# Patient Record
Sex: Female | Born: 1975 | Race: Black or African American | Hispanic: No | Marital: Single | Smoking: Never smoker
Health system: Southern US, Community
[De-identification: ages and names within clinical notes are randomized; demographics above are authoritative.]

## PROBLEM LIST (undated history)

## (undated) DIAGNOSIS — I10 Essential (primary) hypertension: Secondary | ICD-10-CM

## (undated) DIAGNOSIS — R112 Nausea with vomiting, unspecified: Secondary | ICD-10-CM

## (undated) DIAGNOSIS — K219 Gastro-esophageal reflux disease without esophagitis: Secondary | ICD-10-CM

## (undated) DIAGNOSIS — D219 Benign neoplasm of connective and other soft tissue, unspecified: Secondary | ICD-10-CM

## (undated) DIAGNOSIS — D649 Anemia, unspecified: Secondary | ICD-10-CM

## (undated) DIAGNOSIS — Z9889 Other specified postprocedural states: Secondary | ICD-10-CM

## (undated) DIAGNOSIS — E119 Type 2 diabetes mellitus without complications: Secondary | ICD-10-CM

## (undated) HISTORY — PX: BREAST SURGERY: SHX581

## (undated) HISTORY — PX: MYOMECTOMY: SHX85

## (undated) HISTORY — PX: CHOLECYSTECTOMY: SHX55

---

## 1999-02-02 ENCOUNTER — Emergency Department (HOSPITAL_COMMUNITY): Admission: EM | Admit: 1999-02-02 | Discharge: 1999-02-02 | Payer: Self-pay | Admitting: Emergency Medicine

## 2000-05-30 ENCOUNTER — Encounter (INDEPENDENT_AMBULATORY_CARE_PROVIDER_SITE_OTHER): Payer: Self-pay | Admitting: Specialist

## 2000-05-30 ENCOUNTER — Ambulatory Visit (HOSPITAL_BASED_OUTPATIENT_CLINIC_OR_DEPARTMENT_OTHER): Admission: RE | Admit: 2000-05-30 | Discharge: 2000-05-31 | Payer: Self-pay | Admitting: Specialist

## 2000-11-17 ENCOUNTER — Encounter: Admission: RE | Admit: 2000-11-17 | Discharge: 2000-11-17 | Payer: Self-pay | Admitting: *Deleted

## 2001-09-22 ENCOUNTER — Encounter: Payer: Self-pay | Admitting: Surgery

## 2001-09-22 ENCOUNTER — Encounter: Admission: RE | Admit: 2001-09-22 | Discharge: 2001-09-22 | Payer: Self-pay | Admitting: Surgery

## 2001-10-11 ENCOUNTER — Other Ambulatory Visit: Admission: RE | Admit: 2001-10-11 | Discharge: 2001-10-11 | Payer: Self-pay | Admitting: *Deleted

## 2001-12-27 ENCOUNTER — Encounter: Payer: Self-pay | Admitting: Emergency Medicine

## 2001-12-27 ENCOUNTER — Emergency Department (HOSPITAL_COMMUNITY): Admission: EM | Admit: 2001-12-27 | Discharge: 2001-12-27 | Payer: Self-pay | Admitting: Emergency Medicine

## 2002-05-24 ENCOUNTER — Emergency Department (HOSPITAL_COMMUNITY): Admission: EM | Admit: 2002-05-24 | Discharge: 2002-05-24 | Payer: Self-pay | Admitting: Emergency Medicine

## 2003-10-14 ENCOUNTER — Encounter: Admission: RE | Admit: 2003-10-14 | Discharge: 2003-10-14 | Payer: Self-pay | Admitting: Internal Medicine

## 2003-10-14 ENCOUNTER — Encounter: Admission: RE | Admit: 2003-10-14 | Discharge: 2004-01-12 | Payer: Self-pay | Admitting: Internal Medicine

## 2004-06-22 ENCOUNTER — Emergency Department (HOSPITAL_COMMUNITY): Admission: EM | Admit: 2004-06-22 | Discharge: 2004-06-22 | Payer: Self-pay | Admitting: Emergency Medicine

## 2004-10-20 ENCOUNTER — Ambulatory Visit: Payer: Self-pay | Admitting: Internal Medicine

## 2004-12-08 ENCOUNTER — Other Ambulatory Visit: Admission: RE | Admit: 2004-12-08 | Discharge: 2004-12-08 | Payer: Self-pay | Admitting: Gynecology

## 2005-01-08 ENCOUNTER — Encounter: Admission: RE | Admit: 2005-01-08 | Discharge: 2005-01-08 | Payer: Self-pay | Admitting: *Deleted

## 2005-02-17 ENCOUNTER — Encounter (INDEPENDENT_AMBULATORY_CARE_PROVIDER_SITE_OTHER): Payer: Self-pay | Admitting: Specialist

## 2005-02-17 ENCOUNTER — Ambulatory Visit (HOSPITAL_COMMUNITY): Admission: RE | Admit: 2005-02-17 | Discharge: 2005-02-18 | Payer: Self-pay | Admitting: General Surgery

## 2006-01-04 ENCOUNTER — Ambulatory Visit: Payer: Self-pay | Admitting: Internal Medicine

## 2006-06-28 IMAGING — US US ABDOMEN COMPLETE
1 series · 14 of 25 positions shown · non-contrast
Comparison: none

CLINICAL DATA: Right upper quadrant abdominal pain.  Nausea. 
 ULTRASOUND ABDOMEN COMPLETE:

[Series 1: unknown · 0.23mm/px · 14 of 67 slices shown]
[im 1/67]
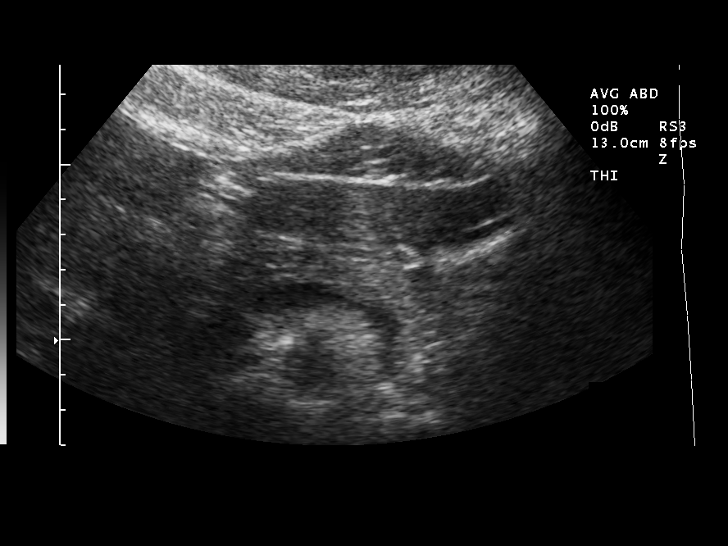
[im 6/67]
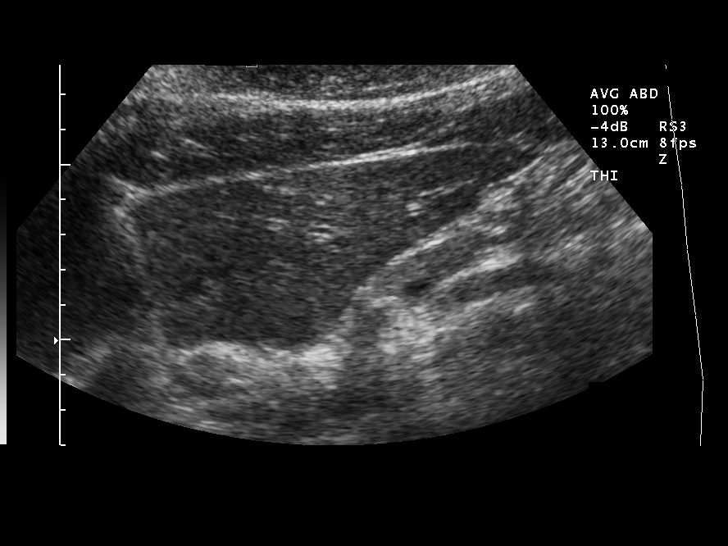
[im 12/67]
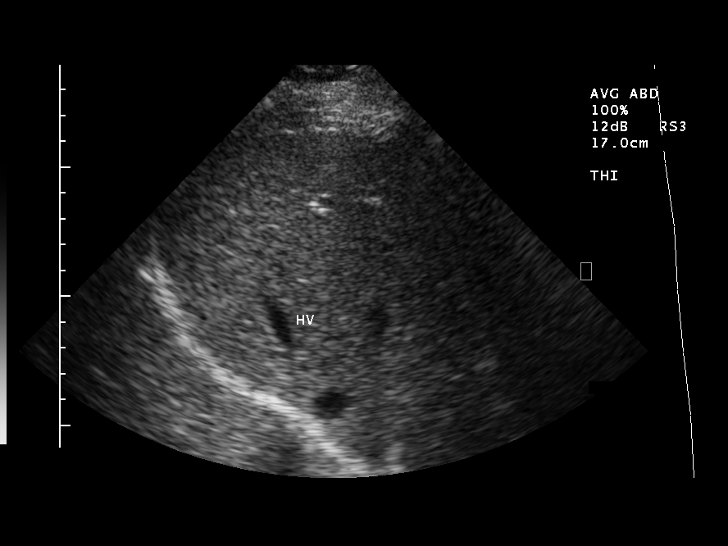
[im 17/67]
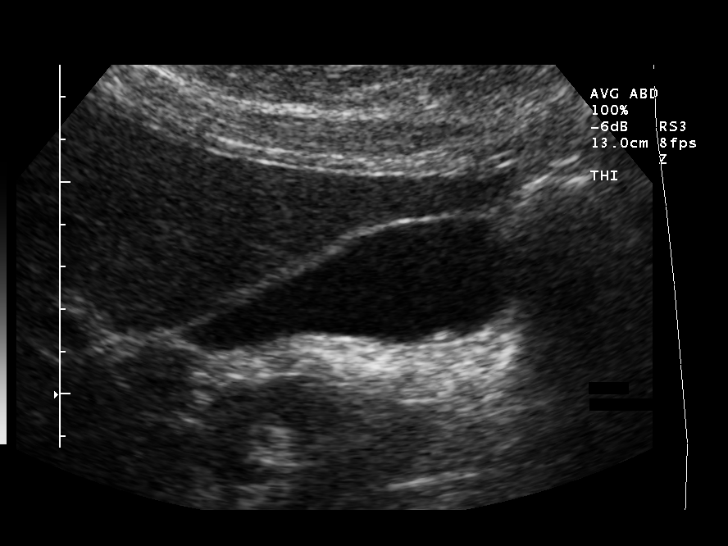
[im 23/67]
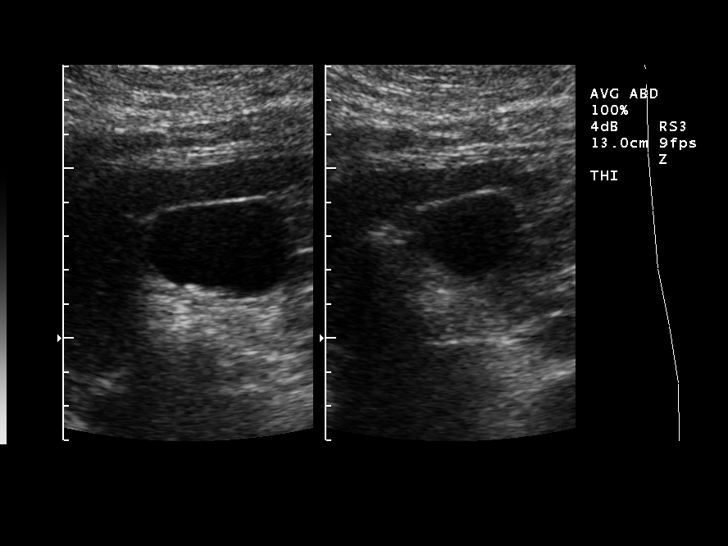
[im 25/67]
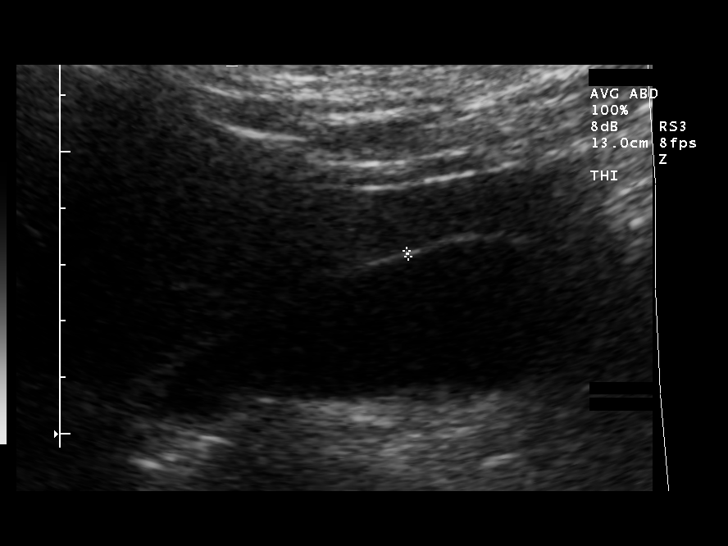
[im 31/67]
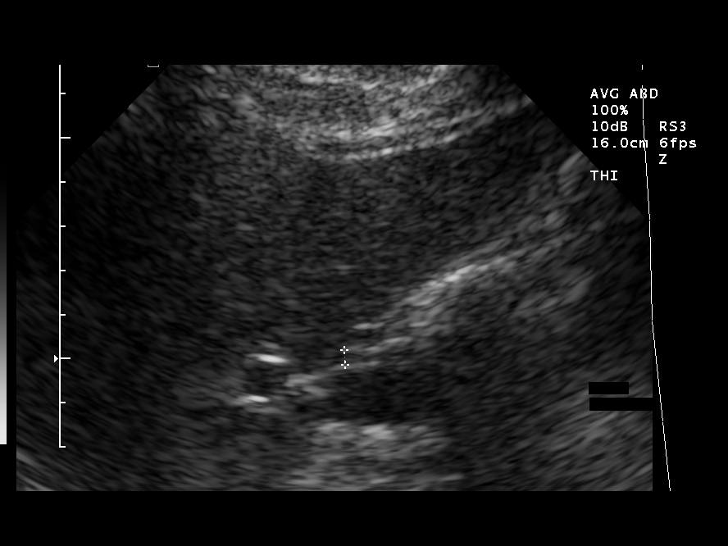
[im 36/67]
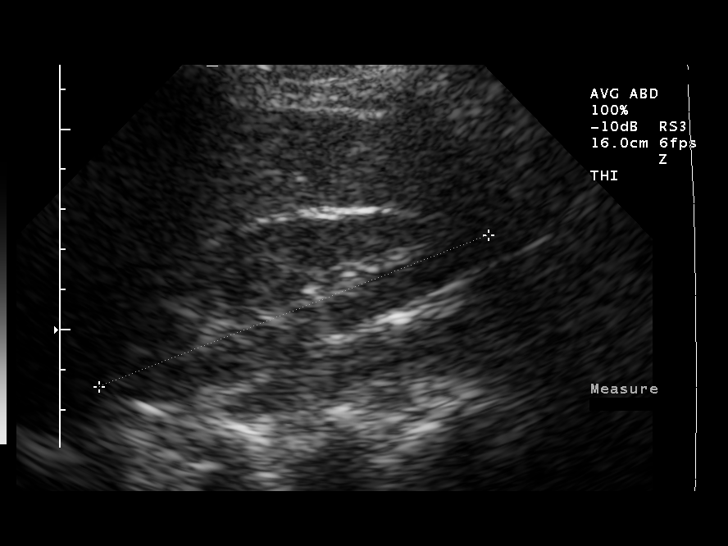
[im 42/67]
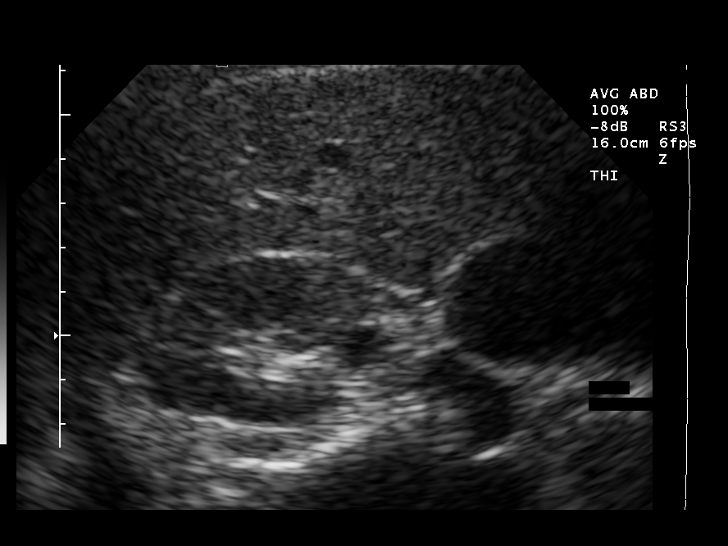
[im 45/67]
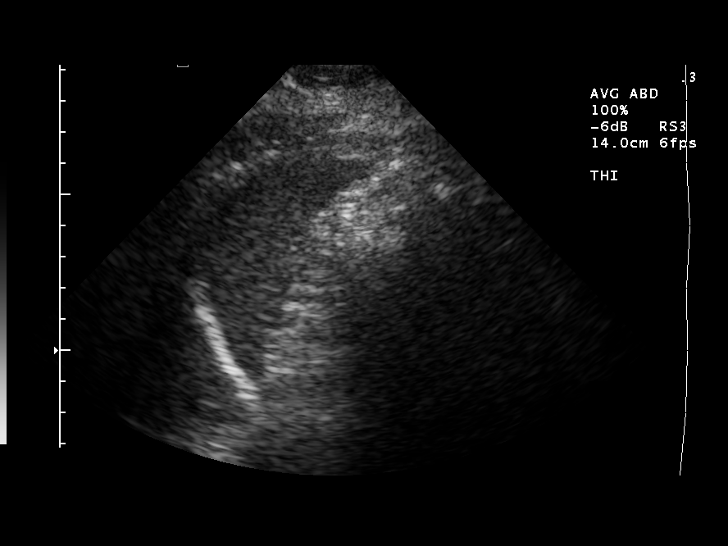
[im 50/67]
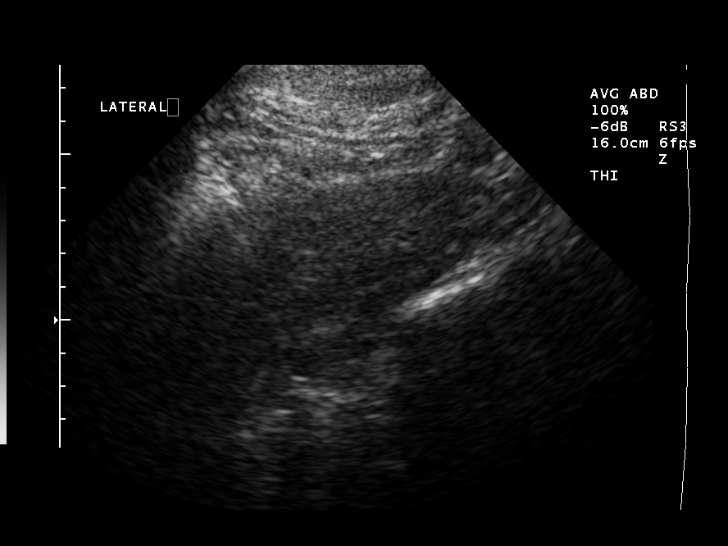
[im 56/67]
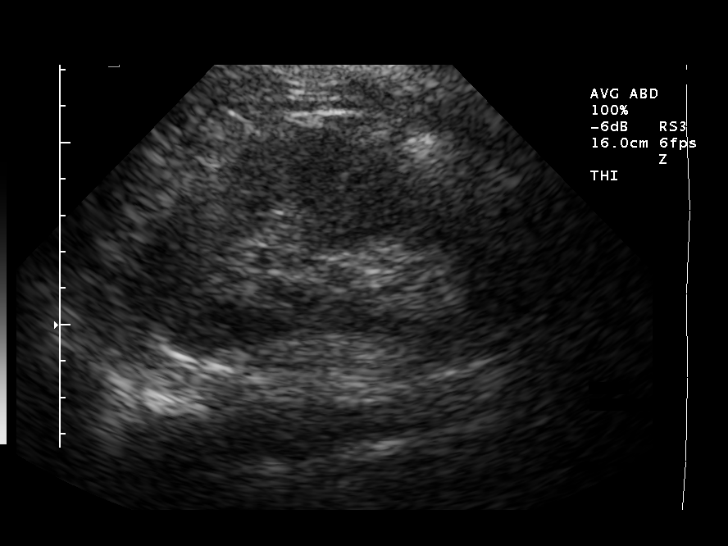
[im 61/67]
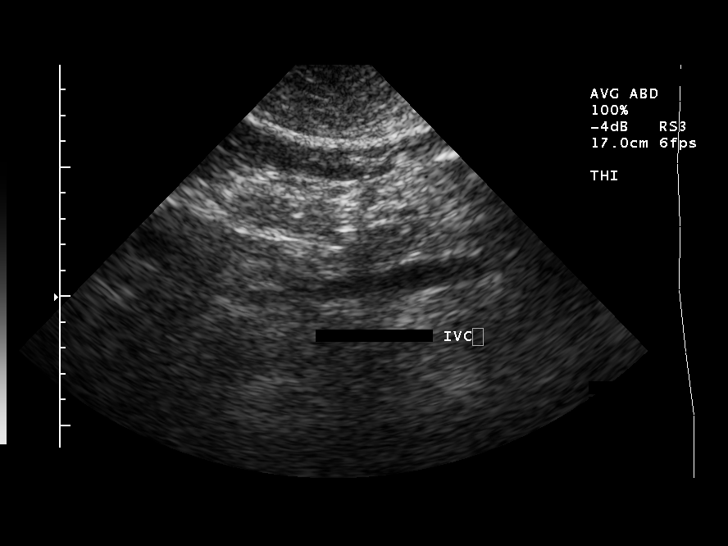
[im 67/67]
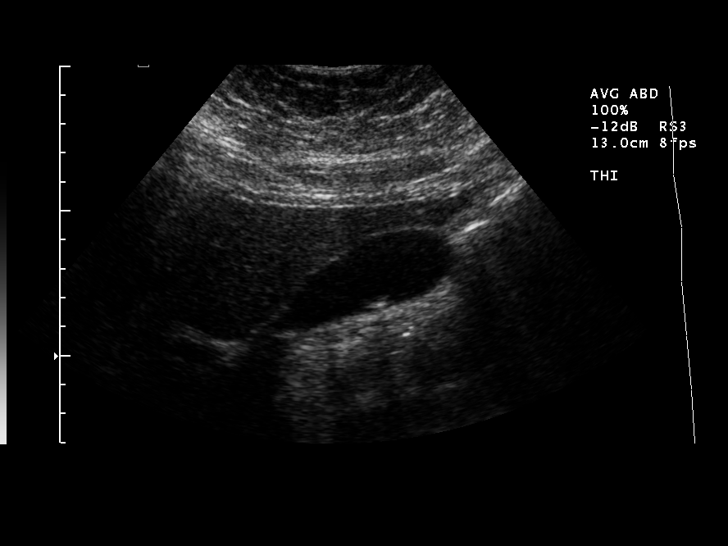

[14 of 25 positions shown; findings below may reference images not displayed]

FINDINGS: Comparison is made with digitized [REDACTED] abdominal ultrasound, 09/22/01.  Since that study, again seen are three small shadowing gallstones.  Slightly progressive sludge is seen within the gallbladder with the gallbladder otherwise unremarkable.  Wall thickness is 1 mm.  No dilated intrahepatic nor extrahepatic bile ducts are seen with the common bile duct measuring normally at 4 mm.  The liver, inferior vena cava, pancreas, spleen (8.5 cm long), right kidney (10.4 cm long), left kidney (11.6 cm long), and abdominal aorta (maximal diameter 1.8 cm) are sonographically normal.
IMPRESSION: Since [REDACTED] abdominal ultrasound 09/22/01:
 1.    Stable three small gallstones with additional slight sludge within the gallbladder.  
 2.    Otherwise normal.

## 2008-07-26 ENCOUNTER — Emergency Department (HOSPITAL_COMMUNITY): Admission: EM | Admit: 2008-07-26 | Discharge: 2008-07-26 | Payer: Self-pay | Admitting: Emergency Medicine

## 2008-08-07 ENCOUNTER — Emergency Department (HOSPITAL_COMMUNITY): Admission: EM | Admit: 2008-08-07 | Discharge: 2008-08-07 | Payer: Self-pay | Admitting: Emergency Medicine

## 2011-01-08 NOTE — Op Note (Signed)
[name], [name]                ACCOUNT NO.:  [id number]   MEDICAL RECORD NO.:  [id number]          PATIENT TYPE:  OIB   LOCATION:  6125                         FACILITY:  [facility]   PHYSICIAN:  Ollen Gross. Vernell Morgans, M.D. DATE OF BIRTH:  01-01-1976   DATE OF PROCEDURE:  02/17/2005  DATE OF DISCHARGE:  02/18/2005                                 OPERATIVE REPORT   PREOPERATIVE DIAGNOSIS:  Gallstones.   POSTOPERATIVE DIAGNOSIS:  Gallstones.   PROCEDURE:  Laparoscopic cholecystectomy with intraoperative cholangiogram.   SURGEON:  Ollen Gross. Carolynne Edouard, M.D.   ASSISTANT:  Sharlet Salina T. Hoxworth, M.D.   ANESTHESIA:  General endotracheal.   PROCEDURE:  After informed consent was obtained, the patient was brought to  the operating room and placed in supine position on the operating table.  After adequate induction of general anesthesia, the patient's abdomen was  prepped with Betadine and draped in usual sterile manner. The area below the  umbilicus was infiltrated with 0.25% Marcaine. A small incision was made  with a 15 blade knife. This incision was carried down through the  subcutaneous tissue bluntly hemostat and [employer] retractors until the  linea alba was identified.  The linea alba was incised with a 15 blade knife  and each side was grasped with Kocher clamps and elevated anteriorly. The  preperitoneal space was then probed bluntly with a hemostat until the  peritoneum was opened and access was gained to the abdominal cavity.  A 0  Vicryl pursestring stitch was placed in the fascia surrounding the opening.  A Hasson cannula was placed through the opening and anchored in place with  the previously placed Vicryl pursestring stitch.  The abdomen was then  insufflated carbon dioxide without difficulty. The patient was placed in the  head-up position, rotated slightly with the right side up.  A laparoscope  was placed through the Hasson cannula and the right upper quadrant was  inspected. The  dome of the gallbladder and liver were readily identified.  Next the epigastric region was infiltrated with 0.25% Marcaine.  A small  incision was made with a 15 blade knife and a 10 mm port was placed bluntly  through this incision into the abdominal cavity under direct vision. Sites  were then chosen laterally on the right side of the abdomen for placement of  5 mm ports. Each of these areas was infiltrated with 0.25% Marcaine. Small  stab incisions were made with a 15 blade knife and 5 mm ports were placed  bluntly through these incisions into the abdominal cavity under direct  vision.  A blunt grasper was placed through the lateral most 5 mm port and  used to grasp the dome of gallbladder and elevate it anteriorly and  superiorly. Another blunt grasper was placed through the other 5 mm port and  used to retract on the body and neck of the gallbladder. A dissector was  placed through the epigastric port and using electrocautery, the peritoneal  reflection at the gallbladder neck was opened.  Blunt dissection was then  carried out in this area until the gallbladder  neck cystic duct junction was  readily identified and a good window was created. A single clip was placed  on the gallbladder neck. The small ductotomy was made just below the clip  with the laparoscopic scissors. A 14 gauge Angiocath was placed  percutaneously through the anterior abdominal wall under direct vision. The  Reddick cholangiogram catheter was placed through the Angiocath and flushed.  The Reddick catheter was then placed within the cystic duct and anchored in  place with a clip. The cholangiogram was obtained that showed no filling  defects, good emptying into the duodenum and good length on the cystic duct.  The anchoring clip and catheters were then removed from the patient. Three  clips were placed proximally on the cystic duct and duct was divided between  the two sets of clips.  Posterior to this, the cystic  artery was identified  and again dissected bluntly in a circumferential manner until a good window  was created.  Two clips were placed proximally and one distally on the  artery and the artery was divided between the two. Next, a laparoscopic hook  cautery device was used to separate the gallbladder from the liver bed.  Prior to completely detaching the gallbladder from liver bed, the liver bed  was inspected.  Several small bleeding points coagulated with  electrocautery.  The gallbladder was then detached the rest of the way from  the liver bed without difficulty. A laparoscopic bag was placed through the  epigastric port. The gallbladder was placed within the bag and bag was  sealed. The abdomen was then irrigated with copious amounts of saline until  the effluent was clear. The liver bed was inspected again and found to be  hemostatic. The laparoscope was then moved to the epigastric port. The  gallbladder grasper was placed through the Hasson cannula and used to grasp  the opening of the bag. The bag with the gallbladder was then removed with  the Hasson cannula through the infraumbilical port without difficulty. The  fascial defect was closed with the previously placed Vicryl pursestring  stitch as well as with another interrupted 0 Vicryl stitch. The rest of  ports were removed under direct vision and were all found to be hemostatic.  Gas was allowed to escape. The skin incisions were all closed with  interrupted 4-0 Monocryl subcuticular stitches. Benzoin and Steri-Strips  were applied. The patient tolerated well. At the end of the case, all  needle, sponge and instruments counts were correct. The patient was then  awakened and taken recovery room in stable condition.       PST/MEDQ  D:  02/19/2005  T:  02/19/2005  Job:  [id number]

## 2011-01-08 NOTE — Op Note (Signed)
[place]  Patient:    [name], [name]                       MRN: [id number] Adm. Date:  [id number] Attending:  Gustavus Messing CC:         Yaakov Guthrie. Shon Hough, M.D. x 2   Operative Report  INDICATIONS:  This is a 36 year old lady with severe macromastia and increased back and shoulder pain secondary to large breasts with increased accessory breast tissue.  PROCEDURE:  Bilateral breast reductions, excision of accessory breast tissue.  SURGEON:  Yaakov Guthrie. Shon Hough, M.D.  ASSISTANT:  Margaretha Sheffield, RN  ANESTHESIA:  General anesthesia.  ESTIMATED BLOOD LOSS:  150 cc.  DESCRIPTION OF PROCEDURE:  Preoperatively, the patient was set up and drawn for the new inferior pedicle reduction mammoplasty, remarking the nipple areolar complexes 20 cm from the suprasternal notch.  She then underwent general anesthesia intubated orally.  Prep was done to the chest and breast areas in the routine fashion using Betadine soap and solution, and walled off with sterile towels and drapes so as to make a sterile field.  0.25% Xylocaine with epinephrine was injected locally, a total of 100 cc per side.  After waiting the appropriate amount of time, the wounds were scored with #15 blade. The skin over the inferior pedicle was deepithelialized with #20 blade.  The medial and lateral fatty dermal pedicles were excised down to underlying fascia.  Out laterally, more tissue was taken and extreme amounts of accessory breast tissue was removed over the lattissimus dorsi area, axillary areas with sharp dissection and using a Bovie on coagulation.  The new keyhole area was debulked and after this, the flaps were transposed and stayed.  After proper hemostasis with 3-0 Prolene, subcutaneous closure was done with 3-0 Monocryl x 2 layers, and then a running subcuticular stitch of 3-0 Monocryl and 5-0 Monocryl throughout the inverted T.  The wounds were drained with #10  Blake drains which were placed in the depths of the wound and brought out through the lateral most portion of the incision and secured with 3-0 Prolene.  The wounds were cleansed.  Steri-Strips and soft dressings applied to all of the areas.  She withstood the procedures very well and was taken to the recovery room in good condition. DD:  05/30/00 TD:  05/30/00 Job: [id number]

## 2011-08-24 HISTORY — PX: NASAL SINUS SURGERY: SHX719

## 2011-10-12 ENCOUNTER — Ambulatory Visit: Payer: Managed Care, Other (non HMO) | Admitting: Internal Medicine

## 2011-10-12 DIAGNOSIS — J019 Acute sinusitis, unspecified: Secondary | ICD-10-CM

## 2011-10-12 DIAGNOSIS — R05 Cough: Secondary | ICD-10-CM

## 2011-10-12 DIAGNOSIS — R059 Cough, unspecified: Secondary | ICD-10-CM

## 2011-10-12 DIAGNOSIS — J329 Chronic sinusitis, unspecified: Secondary | ICD-10-CM

## 2011-10-12 MED ORDER — HYDROCODONE-ACETAMINOPHEN 7.5-500 MG/15ML PO SOLN: 5.0000 mL | Freq: Four times a day (QID) | ORAL | Status: AC | PRN

## 2011-10-12 MED ORDER — AMOXICILLIN 500 MG PO CAPS: 1000.0000 mg | ORAL_CAPSULE | Freq: Two times a day (BID) | ORAL | Status: AC

## 2011-10-12 NOTE — Progress Notes (Signed)
  Subjective:    Patient ID: [name], female    DOB: Jan 14, 1976, 36 y.o.   MRN: [id number]  HPI HA, sinusitis, cough No sob Discharge yellow-green   Review of Systems  HX of nasal, sinus surgery   Objective:   Physical Exam  Constitutional: She is oriented to person, place, and time. She appears well-developed and well-nourished. No distress.  HENT:  Nose: Mucosal edema, rhinorrhea, sinus tenderness and nasal deformity present. Right sinus exhibits maxillary sinus tenderness and frontal sinus tenderness. Left sinus exhibits maxillary sinus tenderness and frontal sinus tenderness.  Eyes: EOM are normal.  Neck: Neck supple.  Pulmonary/Chest: Effort normal and breath sounds normal.  Neurological: She is alert and oriented to person, place, and time.          Assessment & Plan:   Amoxil 1g bid x10d Lortab elix prn cough

## 2011-10-20 ENCOUNTER — Other Ambulatory Visit: Payer: Self-pay | Admitting: Physician Assistant

## 2011-10-24 ENCOUNTER — Ambulatory Visit: Payer: Managed Care, Other (non HMO) | Admitting: Internal Medicine

## 2011-10-24 DIAGNOSIS — Z131 Encounter for screening for diabetes mellitus: Secondary | ICD-10-CM | POA: Insufficient documentation

## 2011-10-24 DIAGNOSIS — I10 Essential (primary) hypertension: Secondary | ICD-10-CM

## 2011-10-24 DIAGNOSIS — E119 Type 2 diabetes mellitus without complications: Secondary | ICD-10-CM

## 2011-10-24 DIAGNOSIS — Z6830 Body mass index (BMI) 30.0-30.9, adult: Secondary | ICD-10-CM | POA: Insufficient documentation

## 2011-10-24 LAB — COMPREHENSIVE METABOLIC PANEL
ALT: 9 U/L (ref 0–35)
AST: 12 U/L (ref 0–37)
Albumin: 4 g/dL (ref 3.5–5.2)
CO2: 22 mEq/L (ref 19–32)
Calcium: 8.9 mg/dL (ref 8.4–10.5)
Creat: 1.15 mg/dL — ABNORMAL HIGH (ref 0.50–1.10)
Total Protein: 7.8 g/dL (ref 6.0–8.3)

## 2011-10-24 LAB — POCT CBC
HCT, POC: 26 % — AB (ref 37.7–47.9)
Lymph, poc: 2.9 (ref 0.6–3.4)
MCHC: 28.1 g/dL — AB (ref 31.8–35.4)
MID (cbc): 0.3 (ref 0–0.9)
MPV: 6.6 fL (ref 0–99.8)
POC LYMPH PERCENT: 50.4 %L — AB (ref 10–50)
POC MID %: 5.8 %M (ref 0–12)
RBC: 4.07 M/uL (ref 4.04–5.48)
WBC: 5.7 10*3/uL (ref 4.6–10.2)

## 2011-10-24 LAB — POCT GLYCOSYLATED HEMOGLOBIN (HGB A1C): Hemoglobin A1C: 5.6

## 2011-10-24 MED ORDER — METFORMIN HCL 500 MG PO TABS: 500.0000 mg | ORAL_TABLET | Freq: Every day | ORAL | Status: DC

## 2011-10-24 MED ORDER — HYDROCHLOROTHIAZIDE 25 MG PO TABS: 25.0000 mg | ORAL_TABLET | Freq: Every day | ORAL | Status: DC

## 2011-10-24 MED ORDER — ATENOLOL 50 MG PO TABS: 50.0000 mg | ORAL_TABLET | Freq: Every day | ORAL | Status: DC

## 2011-10-24 NOTE — Progress Notes (Signed)
  Subjective:    Patient ID: [name], female    DOB: [birth year], 36 y.o.   MRN: [id number]  HPIHere for followup of diabetes and hypertension and for medication refills She has been stable since her last visit in September except for undergoing turbinate reduction surgery last month. She suffered a sinus infection about 10 days ago but has responded to treatment.    Review of Systems  Constitutional: Negative.  Negative for appetite change and fatigue.       She has lost 6 pounds since last visit and is trying to lose more  Eyes: Negative.  Negative for itching and visual disturbance.  Respiratory: Negative.  Negative for cough, shortness of breath and stridor.   Cardiovascular: Negative.  Negative for chest pain and palpitations.  Gastrointestinal: Negative.  Negative for nausea and abdominal pain.  Genitourinary: Negative.  Negative for urgency, difficulty urinating and pelvic pain.  Musculoskeletal: Negative for gait problem.  Neurological: Negative for numbness.       Objective:   Physical Exam Vital signs are stable except for weight of 256 pounds HEENT is clear There is no thyromegaly Heart is regular without murmurs rubs or gallops Extremities show no edema or sensory loss in the lower extremities       Assessment & Plan:  Problem #1 type 2 diabetes  Problem #2 BMI greater than 30 Problem #3 hypertension Problem #4 allergic rhinitis  Routine labs will be checked and she'll be notified She'll use Zyrtec for her allergies Refill atenolol hydrochlorothiazide and metformin

## 2011-10-27 ENCOUNTER — Encounter: Payer: Self-pay | Admitting: Internal Medicine

## 2011-11-16 ENCOUNTER — Other Ambulatory Visit: Payer: Self-pay | Admitting: Family Medicine

## 2012-05-17 ENCOUNTER — Ambulatory Visit: Payer: Managed Care, Other (non HMO) | Admitting: Family Medicine

## 2012-05-17 VITALS — BP 116/83 | HR 72 | Temp 98.4°F | Resp 18 | Ht 67.5 in | Wt 254.0 lb

## 2012-05-17 DIAGNOSIS — E669 Obesity, unspecified: Secondary | ICD-10-CM

## 2012-05-17 DIAGNOSIS — D649 Anemia, unspecified: Secondary | ICD-10-CM

## 2012-05-17 DIAGNOSIS — I10 Essential (primary) hypertension: Secondary | ICD-10-CM

## 2012-05-17 DIAGNOSIS — E119 Type 2 diabetes mellitus without complications: Secondary | ICD-10-CM

## 2012-05-17 LAB — POCT CBC
Granulocyte percent: 38.9 %G (ref 37–80)
Hemoglobin: 8.2 g/dL — AB (ref 12.2–16.2)
Lymph, poc: 2.5 (ref 0.6–3.4)
MCH, POC: 19.1 pg — AB (ref 27–31.2)
MCHC: 28 g/dL — AB (ref 31.8–35.4)
MCV: 68.2 fL — AB (ref 80–97)
MPV: 7 fL (ref 0–99.8)
POC LYMPH PERCENT: 53.3 %L — AB (ref 10–50)
Platelet Count, POC: 367 10*3/uL (ref 142–424)
RBC: 4.3 M/uL (ref 4.04–5.48)
RDW, POC: 21.1 %

## 2012-05-17 LAB — BASIC METABOLIC PANEL
Calcium: 9.1 mg/dL (ref 8.4–10.5)
Chloride: 106 mEq/L (ref 96–112)

## 2012-05-17 MED ORDER — HYDROCHLOROTHIAZIDE 25 MG PO TABS: 25.0000 mg | ORAL_TABLET | Freq: Every day | ORAL | Status: AC

## 2012-05-17 MED ORDER — METFORMIN HCL 500 MG PO TABS: 500.0000 mg | ORAL_TABLET | Freq: Every day | ORAL | Status: DC

## 2012-05-17 NOTE — Progress Notes (Signed)
[facility] [address], [place] [address] [phone]  Date:  05/17/2012   Name:  [name]   DOB:  [birth year]   MRN:  [id number]  PCP:  No primary provider on file.    Chief Complaint: Medication Refill   History of Present Illness:  [name] is a 36 y.o. very pleasant female patient who presents with the following:  History of obesity, HTN and DM.  At last visit in March she weighed 256 lbs- 254 today.  Her A1c looked fine- 5.3.  She also had a low Hg of 7.3 at her visit in March.    She would like to stop one of her BP medications if possible.  She has lost a few lbs, is eating right and exercising- this seems to be helping with her BP.  She checks it regularly and it is usually about 110s/ 80.s   Her home blood glucose can run around 80- 90.    She tried to donate blood last month and was denied due to anemia.  She has added some otc multivitamin with iron- she started this about 2 months ago.   She periods usually last about 6 days- however real bleeding for 4 or 5 days, she does not consider her bleeding to be heavy. On her heaviest days she may change her protection every 3 or 4 hours for one day, then less frequently.   She has not noted any other bleeding, easy bruising, epistaxis, etc.    LMP 04/29/12 She had her flu shot yesterday at work  Patient Active Problem List  Diagnosis  . HTN (hypertension)  . DM (diabetes mellitus screen)  . BMI 30.0-30.9,adult    No past medical history on file.  No past surgical history on file.  History  Substance Use Topics  . Smoking status: Never Smoker   . Smokeless tobacco: Not on file  . Alcohol Use: Not on file    No family history on file.  No Known Allergies  Medication list has been reviewed and updated.  Current Outpatient Prescriptions on File Prior to Visit  Medication Sig Dispense Refill  . atenolol (TENORMIN) 50 MG tablet Take 1 tablet (50 mg total) by mouth daily.  90  tablet  1  . hydrochlorothiazide (HYDRODIURIL) 25 MG tablet Take 1 tablet (25 mg total) by mouth daily.  90 tablet  1  . metFORMIN (GLUCOPHAGE) 500 MG tablet Take 1 tablet (500 mg total) by mouth daily.  90 tablet  1    Review of Systems:  As per HPI- otherwise negative.   Physical Examination: Filed Vitals:   05/17/12 0850  BP: 116/83  Pulse: 72  Temp: 98.4 F (36.9 C)  Resp: 18   Filed Vitals:   05/17/12 0850  Height: 5' 7.5" (1.715 m)  Weight: 254 lb (115.214 kg)   Body mass index is 39.19 kg/(m^2). Ideal Body Weight: Weight in (lb) to have BMI = 25: 161.7   GEN: WDWN, NAD, Non-toxic, A & O x 3, obese HEENT: Atraumatic, Normocephalic. Neck supple. No masses, No LAD. Ears and Nose: No external deformity. CV: RRR, No M/G/R. No JVD. No thrill. No extra heart sounds. PULM: CTA B, no wheezes, crackles, rhonchi. No retractions. No resp. distress. No accessory muscle use. EXTR: No c/c/e NEURO Normal gait.  PSYCH: Normally interactive. Conversant. Not depressed or anxious appearing.  Calm demeanor.   Results for orders placed in visit on 05/17/12  POCT CBC  Component Value Range   WBC 4.6  4.6 - 10.2 K/uL   Lymph, poc 2.5  0.6 - 3.4   POC LYMPH PERCENT 53.3 (*) 10 - 50 %L   MID (cbc) 0.4  0 - 0.9   POC MID % 7.8  0 - 12 %M   POC Granulocyte 1.8 (*) 2 - 6.9   Granulocyte percent 38.9  37 - 80 %G   RBC 4.30  4.04 - 5.48 M/uL   Hemoglobin 8.2 (*) 12.2 - 16.2 g/dL   HCT, POC 16.1 (*) 09.6 - 47.9 %   MCV 68.2 (*) 80 - 97 fL   MCH, POC 19.1 (*) 27 - 31.2 pg   MCHC 28.0 (*) 31.8 - 35.4 g/dL   RDW, POC 04.5     Platelet Count, POC 367  142 - 424 K/uL   MPV 7.0  0 - 99.8 fL  POCT GLYCOSYLATED HEMOGLOBIN (HGB A1C)      Component Value Range   Hemoglobin A1C 5.3      Assessment and Plan: 1. DM (diabetes mellitus)  Basic metabolic panel, POCT glycosylated hemoglobin (Hb A1C), metFORMIN (GLUCOPHAGE) 500 MG tablet  2. Anemia  POCT CBC, Ferritin  3. HTN (hypertension)   Basic metabolic panel, hydrochlorothiazide (HYDRODIURIL) 25 MG tablet  4. Obesity     [name] is doing well with her lifestyle changes. DM continues to be very well controlled.  Will continue her metformin due to general benefits of metformin use.  She would like to stop a BP medication if possible- we will D/C her Telnormin today, but she will keep an eye on her BP and if it goes up we can restart.  Continue HCTZ  Suspect she has iron def anemia.  Await ferritin and will follow-up with her.  Will plan further follow- up pending labs.   Abbe Amsterdam, MD

## 2012-05-18 ENCOUNTER — Telehealth: Payer: Self-pay

## 2012-05-18 NOTE — Telephone Encounter (Signed)
This was one reading after stopping them. She told Dr. Patsy Lager they had been running good. Lets have her check several more over the next few days have give [place] a call on Monday with those readings.

## 2012-05-18 NOTE — Telephone Encounter (Signed)
133/80, patient d/c her BP meds yesterday, wants to know if she should go back on her meds. Has Rx for HCTZ, would need Atenolol 50 mg renewed, if we want her to resume meds.

## 2012-05-18 NOTE — Telephone Encounter (Signed)
Lm for her to call me back, what is her BP?

## 2012-05-18 NOTE — Telephone Encounter (Signed)
PT STATES SHE TOOK HER PRESSURE TODAY AND IT WAS KIND OF HIGH, WOULD LIKE TO SPEAK WITH SOMEONE ABOUT CALLING SOMETHING IN FOR HER WAS SEEN YESTERDAY BY DR COPLAND. PLEASE CALL [phone]

## 2012-05-19 NOTE — Telephone Encounter (Signed)
LMOM to CB. 

## 2012-05-19 NOTE — Telephone Encounter (Signed)
Gave pt instr's from [place] and she agreed to keep record of readings and CB on Monday.

## 2012-05-20 ENCOUNTER — Telehealth: Payer: Self-pay | Admitting: Family Medicine

## 2012-05-20 DIAGNOSIS — D509 Iron deficiency anemia, unspecified: Secondary | ICD-10-CM

## 2012-05-20 MED ORDER — FERROUS SULFATE 325 (65 FE) MG PO TABS: 325.0000 mg | ORAL_TABLET | Freq: Two times a day (BID) | ORAL | Status: DC

## 2012-05-20 NOTE — Telephone Encounter (Signed)
Called her to discuss her labs- namely her ferritin level of 1.  We will start an iron supplement- 325 BID- and recheck in 3 months.  Went over how to handle constipation that can be associated.  She is a little concerned because her BP is running 130/80, instead of 110/70 as it did when she was on atenolol.  Reassured that this is ok- if she starts running 140/ 90 we might want to add the atenolol back, but for now she wants to try doing without it.  She will feel free to call if any other concerns.   Results for orders placed in visit on 05/17/12  POCT CBC      Component Value Range   WBC 4.6  4.6 - 10.2 K/uL   Lymph, poc 2.5  0.6 - 3.4   POC LYMPH PERCENT 53.3 (*) 10 - 50 %L   MID (cbc) 0.4  0 - 0.9   POC MID % 7.8  0 - 12 %M   POC Granulocyte 1.8 (*) 2 - 6.9   Granulocyte percent 38.9  37 - 80 %G   RBC 4.30  4.04 - 5.48 M/uL   Hemoglobin 8.2 (*) 12.2 - 16.2 g/dL   HCT, POC 16.1 (*) 09.6 - 47.9 %   MCV 68.2 (*) 80 - 97 fL   MCH, POC 19.1 (*) 27 - 31.2 pg   MCHC 28.0 (*) 31.8 - 35.4 g/dL   RDW, POC 04.5     Platelet Count, POC 367  142 - 424 K/uL   MPV 7.0  0 - 99.8 fL  BASIC METABOLIC PANEL      Component Value Range   Sodium 139  135 - 145 mEq/L   Potassium 4.5  3.5 - 5.3 mEq/L   Chloride 106  96 - 112 mEq/L   CO2 25  19 - 32 mEq/L   Glucose, Bld 99  70 - 99 mg/dL   BUN 13  6 - 23 mg/dL   Creat 4.09 (*) 8.11 - 1.10 mg/dL   Calcium 9.1  8.4 - 91.4 mg/dL  POCT GLYCOSYLATED HEMOGLOBIN (HGB A1C)      Component Value Range   Hemoglobin A1C 5.3    FERRITIN      Component Value Range   Ferritin 1 (*) 10 - 291 ng/mL

## 2012-06-01 ENCOUNTER — Other Ambulatory Visit: Payer: Self-pay | Admitting: Specialist

## 2012-06-01 DIAGNOSIS — N6310 Unspecified lump in the right breast, unspecified quadrant: Secondary | ICD-10-CM

## 2012-06-02 ENCOUNTER — Ambulatory Visit
Admission: RE | Admit: 2012-06-02 | Discharge: 2012-06-02 | Disposition: A | Discharge status: Home / Self Care | Payer: Commercial Indemnity | Source: Ambulatory Visit | Attending: Specialist | Admitting: Specialist

## 2012-06-02 DIAGNOSIS — N6310 Unspecified lump in the right breast, unspecified quadrant: Secondary | ICD-10-CM

## 2012-07-03 ENCOUNTER — Telehealth: Payer: Self-pay

## 2012-07-03 MED ORDER — ATENOLOL 50 MG PO TABS: 50.0000 mg | ORAL_TABLET | Freq: Every day | ORAL | Status: DC

## 2012-07-03 NOTE — Telephone Encounter (Signed)
Pt called back, yesterday 140/100 today 139/96, Friday 135/98. She is normally 116/83. She states her blood pressure started increasing but is concerned because she thinks she may need her blood pressure meds again. Please advise.

## 2012-07-03 NOTE — Telephone Encounter (Signed)
Thanks pt advised, she has follow up plans in Dec to ck her iron. I advised her to keep this as scheduled and we can ck both for her.

## 2012-07-03 NOTE — Telephone Encounter (Signed)
PT STATES DR COPLAND WANTED TO SEE HOW SHE WOULD DO WITHOUT HER MEDS, BUT HER BLOOD PRESSURE IS RUNNING HIGH AND SHE WOULD LIKE TO GO BACK ON THE ATENOLOL. PLEASE CALL [phone]   [facility] [address]

## 2012-07-03 NOTE — Telephone Encounter (Signed)
Called patient, how high is her BP? Left message for her to call me back.

## 2012-07-03 NOTE — Telephone Encounter (Signed)
Rx sent to pharmacy. Recheck in 3 months

## 2012-08-22 ENCOUNTER — Ambulatory Visit (INDEPENDENT_AMBULATORY_CARE_PROVIDER_SITE_OTHER): Payer: Managed Care, Other (non HMO) | Admitting: Emergency Medicine

## 2012-08-22 VITALS — BP 120/81 | HR 61 | Temp 98.5°F | Resp 17 | Ht 67.5 in | Wt 257.0 lb

## 2012-08-22 DIAGNOSIS — J329 Chronic sinusitis, unspecified: Secondary | ICD-10-CM

## 2012-08-22 DIAGNOSIS — B373 Candidiasis of vulva and vagina: Secondary | ICD-10-CM

## 2012-08-22 DIAGNOSIS — B3731 Acute candidiasis of vulva and vagina: Secondary | ICD-10-CM

## 2012-08-22 MED ORDER — AMOXICILLIN 500 MG PO CAPS: ORAL_CAPSULE | ORAL | Status: DC

## 2012-08-22 MED ORDER — FLUCONAZOLE 150 MG PO TABS: 150.0000 mg | ORAL_TABLET | Freq: Once | ORAL | Status: DC

## 2012-08-22 NOTE — Progress Notes (Signed)
  Subjective:    Patient ID: [name], female    DOB: 12-25-1975, 36 y.o.   MRN: [id number]  HPI patient enters with onset on Sunday of head congestion sinus drainage and pain in the left ear She has a history of recurrent sinusitis. She is status post sinus surgery. She's had a thick nasal drainage of purulent material. She has had a minimal cough.  Review of Systems     Objective:   Physical Exam HEENT examination turbinates are red and swollen. TMs are clear. Nose exam reveals turbinates are red and swollen. Throat is normal. Chest is clear to auscultation and percussion        Assessment & Plan:   Patient here with recurrent sinus problems. We'll treat with amoxicillin 1 g twice a day. She will try a netty pott and see if it helps. She was also given Diflucan prescription to help her yeast that comes with the amoxicillin

## 2012-08-22 NOTE — Patient Instructions (Signed)

## 2012-09-29 ENCOUNTER — Other Ambulatory Visit: Payer: Self-pay | Admitting: Physician Assistant

## 2012-09-29 NOTE — Telephone Encounter (Signed)
Needs office visit.

## 2012-10-30 ENCOUNTER — Telehealth: Payer: Self-pay

## 2012-10-30 MED ORDER — ATENOLOL 50 MG PO TABS: 50.0000 mg | ORAL_TABLET | Freq: Every day | ORAL | Status: DC

## 2012-10-30 NOTE — Telephone Encounter (Signed)
Pt is requesting refills of 90 days on all of her medication.  [phone] (H)

## 2012-10-30 NOTE — Telephone Encounter (Signed)
What does she need? Called her left message for call back.

## 2012-10-30 NOTE — Telephone Encounter (Signed)
She needs atenolol. This is sent in for her 1 mo supply, she will come in for recheck before this runs out.

## 2012-10-30 NOTE — Telephone Encounter (Signed)
0

## 2012-11-01 ENCOUNTER — Other Ambulatory Visit: Payer: Self-pay | Admitting: Specialist

## 2012-11-01 DIAGNOSIS — N6310 Unspecified lump in the right breast, unspecified quadrant: Secondary | ICD-10-CM

## 2012-11-30 ENCOUNTER — Ambulatory Visit
Admission: RE | Admit: 2012-11-30 | Discharge: 2012-11-30 | Disposition: A | Discharge status: Home / Self Care | Payer: Commercial Indemnity | Source: Ambulatory Visit | Attending: Specialist | Admitting: Specialist

## 2012-11-30 DIAGNOSIS — N6310 Unspecified lump in the right breast, unspecified quadrant: Secondary | ICD-10-CM

## 2013-01-11 ENCOUNTER — Other Ambulatory Visit: Payer: Self-pay | Admitting: Family Medicine

## 2013-01-11 ENCOUNTER — Other Ambulatory Visit (HOSPITAL_COMMUNITY)
Admission: RE | Admit: 2013-01-11 | Discharge: 2013-01-11 | Disposition: A | Discharge status: Home / Self Care | Payer: Commercial Indemnity | Source: Ambulatory Visit | Attending: Family Medicine | Admitting: Family Medicine

## 2013-01-11 DIAGNOSIS — Z1151 Encounter for screening for human papillomavirus (HPV): Secondary | ICD-10-CM | POA: Insufficient documentation

## 2013-01-11 DIAGNOSIS — Z01419 Encounter for gynecological examination (general) (routine) without abnormal findings: Secondary | ICD-10-CM | POA: Insufficient documentation

## 2013-04-24 ENCOUNTER — Other Ambulatory Visit: Payer: Self-pay | Admitting: Family Medicine

## 2013-05-24 ENCOUNTER — Other Ambulatory Visit: Payer: Self-pay | Admitting: Specialist

## 2013-05-24 DIAGNOSIS — Z9889 Other specified postprocedural states: Secondary | ICD-10-CM

## 2013-05-24 DIAGNOSIS — N6310 Unspecified lump in the right breast, unspecified quadrant: Secondary | ICD-10-CM

## 2013-06-07 ENCOUNTER — Ambulatory Visit
Admission: RE | Admit: 2013-06-07 | Discharge: 2013-06-07 | Disposition: A | Discharge status: Home / Self Care | Payer: Commercial Indemnity | Source: Ambulatory Visit | Attending: Specialist | Admitting: Specialist

## 2013-06-07 DIAGNOSIS — N6310 Unspecified lump in the right breast, unspecified quadrant: Secondary | ICD-10-CM

## 2013-06-07 DIAGNOSIS — Z9889 Other specified postprocedural states: Secondary | ICD-10-CM

## 2013-09-24 ENCOUNTER — Other Ambulatory Visit: Payer: Self-pay | Admitting: Obstetrics & Gynecology

## 2013-09-25 ENCOUNTER — Encounter (HOSPITAL_COMMUNITY): Payer: Self-pay | Admitting: Pharmacist

## 2013-10-03 ENCOUNTER — Encounter (HOSPITAL_COMMUNITY): Payer: Self-pay

## 2013-10-03 ENCOUNTER — Encounter (HOSPITAL_COMMUNITY)
Admission: RE | Admit: 2013-10-03 | Discharge: 2013-10-03 | Disposition: A | Discharge status: Home / Self Care | Payer: Managed Care, Other (non HMO) | Source: Ambulatory Visit | Attending: Obstetrics & Gynecology | Admitting: Obstetrics & Gynecology

## 2013-10-03 HISTORY — DX: Nausea with vomiting, unspecified: R11.2

## 2013-10-03 HISTORY — DX: Type 2 diabetes mellitus without complications: E11.9

## 2013-10-03 HISTORY — DX: Gastro-esophageal reflux disease without esophagitis: K21.9

## 2013-10-03 HISTORY — DX: Anemia, unspecified: D64.9

## 2013-10-03 HISTORY — DX: Essential (primary) hypertension: I10

## 2013-10-03 HISTORY — DX: Other specified postprocedural states: Z98.890

## 2013-10-03 LAB — BASIC METABOLIC PANEL
BUN: 14 mg/dL (ref 6–23)
CO2: 26 meq/L (ref 19–32)
CREATININE: 1.18 mg/dL — AB (ref 0.50–1.10)
Calcium: 9.5 mg/dL (ref 8.4–10.5)
Chloride: 101 mEq/L (ref 96–112)
GFR calc Af Amer: 67 mL/min — ABNORMAL LOW (ref >90)
GFR calc non Af Amer: 58 mL/min — ABNORMAL LOW (ref >90)
Glucose, Bld: 98 mg/dL (ref 70–99)
Potassium: 4.2 mEq/L (ref 3.7–5.3)
Sodium: 137 mEq/L (ref 137–147)

## 2013-10-03 LAB — CBC
HCT: 36.2 % (ref 36.0–46.0)
Hemoglobin: 11.4 g/dL — ABNORMAL LOW (ref 12.0–15.0)
MCH: 26.1 pg (ref 26.0–34.0)
MCHC: 31.5 g/dL (ref 30.0–36.0)
MCV: 82.8 fL (ref 78.0–100.0)
Platelets: 251 10*3/uL (ref 150–400)
RBC: 4.37 MIL/uL (ref 3.87–5.11)
RDW: 22.5 % — ABNORMAL HIGH (ref 11.5–15.5)
WBC: 4.2 10*3/uL (ref 4.0–10.5)

## 2013-10-03 LAB — ABO/RH: ABO/RH(D): O POS

## 2013-10-03 NOTE — Patient Instructions (Addendum)
17-Ketosteroids This is a 24 hour urine test in which the breakdown products of the sex hormones are measured. 17-Ketosteroids form when the body breaks down female sex hormones and other hormones released by the adrenal cortex. This test is used to help diagnose adrenal problems, including adrenal tumors, and adrenal enlargement (hyperplasia). PREPARATION FOR TEST Medications that interfere with the test should be held for several days prior to testing. Ask your caregiver which medications should be withheld and for how long. NORMAL FINDINGS  Female: 6-20 mg/24 hr or 20-70  mol/day (SI units)  Female: 6-17 mg/24 hr or 20-60  mol/day (SI units)  Elderly: values decrease with age  Child under 12 years: less than 5 mg/24 hr  Child 12-15 years: 5-12 mg/24 hr Ranges for normal findings may vary among different laboratories and hospitals. You should always check with your doctor after having lab work or other tests done to discuss the meaning of your test results and whether your values are considered within normal limits. MEANING OF TEST  Your caregiver will go over the test results with you and discuss the importance and meaning of your results, as well as treatment options and the need for additional tests if necessary. OBTAINING THE TEST RESULTS  It is your responsibility to obtain your test results. Ask the lab or department performing the test when and how you will get your results. Document Released: 08/31/2004 Document Revised: 11/01/2011 Document Reviewed: 07/10/2008 [facility] Patient Information 2014 [place], [place]. Injuries in Pregnancy Injuries can happen during pregnancy. Minor falls and accidents usually do not harm the mother or unborn baby. However, any injury should be reported to your doctor. HOME CARE  Do not take aspirin. It can make any bleeding you may have worse.  Put ice on the injured area.  Put ice in a plastic bag.  Place a towel between your skin and the  bag.  Leave the ice on for 15-20 minutes, 03-04 times a day.  Put warm packs on the injured area after 24 hours if told by your doctor.  Have someone care for you and help you if needed.  Do not wear high heels while pregnant.  Remove rugs and loose objects on the floor.  Avoid fire or starting fires.  Avoid lifting heavy pots of boiling liquid. GET HELP RIGHT AWAY IF:  You have been a victim of domestic violence.  You have been in a car accident.  You have more pain in any part of the body.  You have bleeding from your vagina.  Fluid is leaking from your vagina.  You start to have belly cramping (contractions) or pain.  You have a stiff neck or neck pain.  You feel weak or pass out (faint).  You start to throw up (vomit) after an injury.  You have been burned.  You get a headache or have vision problems after an injury.  You do not feel the baby move or the baby is not moving as much as normal. MAKE SURE YOU:  Understand these instructions.  Will watch your condition.  Will get help right away if you are not doing well or get worse. Document Released: 09/11/2010 Document Revised: 11/01/2011 Document Reviewed: 05/16/2013 [facility] Patient Information 2014 [place], [place].

## 2013-10-03 NOTE — Pre-Procedure Instructions (Signed)
No access to EPIC, entire system down. All information obtained via Audrea Muscat, R.N. On  Separate terminals.

## 2013-10-03 NOTE — H&P (Addendum)
[name] is an 38 y.o. female [phone] with large symptomatic uterine fibroids, admitted for abdominal myomectomy. Menses are controlled with Progestin pills though she does have break-through bleeding.  Known fibroids for over 3-4 yrs, but recently symptomatic, pain, menorrhagia, dyspareunia and feeling a mass in the abdomen. Prior 2 elective abortions, SVD, Lap-cholecystectomy. Normal Paps. No breast complaints. Desires to have kids in future but agrees to have blood transfusion and hysterectomy if needed during surgery.  No LMP recorded.   Past Medical History  Diagnosis Date  . PONV (postoperative nausea and vomiting)   . Diabetes mellitus without complication   . GERD (gastroesophageal reflux disease)   . Anemia   . Hypertension    Past Surgical History  Procedure Laterality Date  . Nasal sinus surgery  08/2011  . Cholecystectomy    . Breast surgery      breast reduction   No family history on file.  Social History:  reports that she has never smoked. She does not have any smokeless tobacco history on file. She reports that she does not drink alcohol or use illicit drugs.  Allergies: No Known Allergies  No prescriptions prior to admission   Review of Systems  Constitutional: Negative for fever.  Cardiovascular: Negative for chest pain.  Gastrointestinal: Negative for abdominal pain.  Genitourinary: Negative for dysuria.  Skin: Negative for rash.  Neurological: Negative for headaches.   There were no vitals taken for this visit. Physical Exam  A&O x 3, no acute distress. Pleasant HEENT neg, no thyromegaly Lungs CTA bilat CV RRR, S1S2 normal Abdo soft, non tender, non acute. Large fibroid uterus  Extr no edema/ tenderness Pelvic 18-20 wk uterus w multiple fibroids.   Sono- office - large posterior 12x10 cm myoma and several other myomas, right ovarian 5 cm cyst.  Results for orders placed during the hospital encounter of 10/03/13 (from the past 24 hour(s))   ABO/RH     Status: None   Collection Time    10/03/13  8:50 AM      Result Value Ref Range   ABO/RH(D) O POS    CBC     Status: Abnormal   Collection Time    10/03/13  8:54 AM      Result Value Ref Range   WBC 4.2  4.0 - 10.5 K/uL   RBC 4.37  3.87 - 5.11 MIL/uL   Hemoglobin 11.4 (*) 12.0 - 15.0 g/dL   HCT 36.2  36.0 - 46.0 %   MCV 82.8  78.0 - 100.0 fL   MCH 26.1  26.0 - 34.0 pg   MCHC 31.5  30.0 - 36.0 g/dL   RDW 22.5 (*) 11.5 - 15.5 %   Platelets 251  150 - 400 K/uL  BASIC METABOLIC PANEL     Status: Abnormal   Collection Time    10/03/13  8:54 AM      Result Value Ref Range   Sodium 137  137 - 147 mEq/L   Potassium 4.2  3.7 - 5.3 mEq/L   Chloride 101  96 - 112 mEq/L   CO2 26  19 - 32 mEq/L   Glucose, Bld 98  70 - 99 mg/dL   BUN 14  6 - 23 mg/dL   Creatinine, Ser 1.18 (*) 0.50 - 1.10 mg/dL   Calcium 9.5  8.4 - 10.5 mg/dL   GFR calc non Af Amer 58 (*) >90 mL/min   GFR calc Af Amer 67 (*) >90 mL/min  TYPE AND  SCREEN     Status: None   Collection Time    10/03/13  8:54 AM      Result Value Ref Range   ABO/RH(D) O POS     Antibody Screen NEG     Sample Expiration 10/06/2013     No results found.  Assessment/Plan: 38 yo female with large fibroid uterus, here for abdominal myomectomy and possible right ovarian cystectomy, possible hysterectomy and blood transfusion.  Risks/complications of surgery reviewed incl infection, bleeding, damage to internal organs including bladder, bowels, ureters, blood vessels, other risks from anesthesia, VTE and delayed complications of any surgery, complications in future surgery reviewed.   Kaidin Boehle R 10/03/2013, 7:31 PM  Update- Pt seen, agree with above H&P and plan.  V.Maisey Deandrade, MD

## 2013-10-04 ENCOUNTER — Inpatient Hospital Stay (HOSPITAL_COMMUNITY)
Admission: RE | Admit: 2013-10-04 | Discharge: 2013-10-07 | DRG: 743 | Disposition: A | Discharge status: Home / Self Care | Payer: Managed Care, Other (non HMO) | Source: Ambulatory Visit | Attending: Obstetrics & Gynecology | Admitting: Obstetrics & Gynecology

## 2013-10-04 ENCOUNTER — Inpatient Hospital Stay (HOSPITAL_COMMUNITY): Payer: Managed Care, Other (non HMO) | Admitting: Anesthesiology

## 2013-10-04 ENCOUNTER — Encounter (HOSPITAL_COMMUNITY)
Admission: RE | Disposition: A | Discharge status: Home / Self Care | Payer: Self-pay | Source: Ambulatory Visit | Attending: Obstetrics & Gynecology

## 2013-10-04 ENCOUNTER — Encounter (HOSPITAL_COMMUNITY): Payer: Self-pay | Admitting: Anesthesiology

## 2013-10-04 ENCOUNTER — Encounter (HOSPITAL_COMMUNITY): Payer: Managed Care, Other (non HMO) | Admitting: Anesthesiology

## 2013-10-04 DIAGNOSIS — D251 Intramural leiomyoma of uterus: Principal | ICD-10-CM | POA: Diagnosis present

## 2013-10-04 DIAGNOSIS — I10 Essential (primary) hypertension: Secondary | ICD-10-CM

## 2013-10-04 DIAGNOSIS — Z6830 Body mass index (BMI) 30.0-30.9, adult: Secondary | ICD-10-CM

## 2013-10-04 DIAGNOSIS — IMO0002 Reserved for concepts with insufficient information to code with codable children: Secondary | ICD-10-CM | POA: Diagnosis present

## 2013-10-04 DIAGNOSIS — Z131 Encounter for screening for diabetes mellitus: Secondary | ICD-10-CM

## 2013-10-04 DIAGNOSIS — K219 Gastro-esophageal reflux disease without esophagitis: Secondary | ICD-10-CM | POA: Diagnosis present

## 2013-10-04 DIAGNOSIS — D219 Benign neoplasm of connective and other soft tissue, unspecified: Secondary | ICD-10-CM | POA: Diagnosis present

## 2013-10-04 DIAGNOSIS — D649 Anemia, unspecified: Secondary | ICD-10-CM | POA: Diagnosis present

## 2013-10-04 DIAGNOSIS — N920 Excessive and frequent menstruation with regular cycle: Secondary | ICD-10-CM | POA: Diagnosis present

## 2013-10-04 DIAGNOSIS — E119 Type 2 diabetes mellitus without complications: Secondary | ICD-10-CM | POA: Diagnosis present

## 2013-10-04 DIAGNOSIS — Z9889 Other specified postprocedural states: Secondary | ICD-10-CM

## 2013-10-04 HISTORY — PX: MYOMECTOMY: SHX85

## 2013-10-04 HISTORY — DX: Benign neoplasm of connective and other soft tissue, unspecified: D21.9

## 2013-10-04 LAB — GLUCOSE, CAPILLARY
GLUCOSE-CAPILLARY: 109 mg/dL — AB (ref 70–99)
Glucose-Capillary: 74 mg/dL (ref 70–99)

## 2013-10-04 LAB — PREGNANCY, URINE: PREG TEST UR: NEGATIVE

## 2013-10-04 LAB — PREPARE RBC (CROSSMATCH)

## 2013-10-04 SURGERY — MYOMECTOMY, ABDOMINAL APPROACH
Anesthesia: General | Site: Abdomen

## 2013-10-04 MED ORDER — ONDANSETRON HCL 4 MG/2ML IJ SOLN
INTRAMUSCULAR | Status: DC | PRN
  Administered 2013-10-04: 4 mg via INTRAVENOUS

## 2013-10-04 MED ORDER — LIDOCAINE HCL (CARDIAC) 20 MG/ML IV SOLN
INTRAVENOUS | Status: DC | PRN
  Administered 2013-10-04: 50 mg via INTRAVENOUS

## 2013-10-04 MED ORDER — PNEUMOCOCCAL VAC POLYVALENT 25 MCG/0.5ML IJ INJ
0.5000 mL | INJECTION | INTRAMUSCULAR | Status: AC
  Administered 2013-10-05: 0.5 mL via INTRAMUSCULAR
  Filled 2013-10-04: qty 0.5

## 2013-10-04 MED ORDER — HYDROMORPHONE HCL PF 1 MG/ML IJ SOLN
INTRAMUSCULAR | Status: AC
  Filled 2013-10-04: qty 1

## 2013-10-04 MED ORDER — DEXAMETHASONE SODIUM PHOSPHATE 10 MG/ML IJ SOLN
INTRAMUSCULAR | Status: DC | PRN
  Administered 2013-10-04: 10 mg via INTRAVENOUS

## 2013-10-04 MED ORDER — NEOSTIGMINE METHYLSULFATE 1 MG/ML IJ SOLN
INTRAMUSCULAR | Status: DC | PRN
Start: 2013-10-04 — End: 2013-10-04
  Administered 2013-10-04: 2 mg via INTRAVENOUS

## 2013-10-04 MED ORDER — SCOPOLAMINE 1 MG/3DAYS TD PT72
1.0000 | MEDICATED_PATCH | TRANSDERMAL | Status: DC
Start: 2013-10-04 — End: 2013-10-04
  Administered 2013-10-04: 1.5 mg via TRANSDERMAL

## 2013-10-04 MED ORDER — FENTANYL CITRATE 0.05 MG/ML IJ SOLN
INTRAMUSCULAR | Status: AC
  Filled 2013-10-04: qty 5

## 2013-10-04 MED ORDER — SODIUM CHLORIDE BACTERIOSTATIC 0.9 % IJ SOLN
INTRAMUSCULAR | Status: DC | PRN
  Administered 2013-10-04: 90 mL

## 2013-10-04 MED ORDER — ROCURONIUM BROMIDE 100 MG/10ML IV SOLN
INTRAVENOUS | Status: AC
  Filled 2013-10-04: qty 1

## 2013-10-04 MED ORDER — LIDOCAINE HCL (CARDIAC) 20 MG/ML IV SOLN
INTRAVENOUS | Status: AC
  Filled 2013-10-04: qty 5

## 2013-10-04 MED ORDER — MIDAZOLAM HCL 2 MG/2ML IJ SOLN: 0.5000 mg | Freq: Once | INTRAMUSCULAR | Status: DC | PRN

## 2013-10-04 MED ORDER — MIDAZOLAM HCL 2 MG/2ML IJ SOLN
INTRAMUSCULAR | Status: AC
  Filled 2013-10-04: qty 2

## 2013-10-04 MED ORDER — PANTOPRAZOLE SODIUM 40 MG PO TBEC
40.0000 mg | DELAYED_RELEASE_TABLET | Freq: Every day | ORAL | Status: DC
  Administered 2013-10-05 – 2013-10-07 (×3): 40 mg via ORAL
  Filled 2013-10-04 (×4): qty 1

## 2013-10-04 MED ORDER — METFORMIN HCL 500 MG PO TABS
500.0000 mg | ORAL_TABLET | Freq: Every day | ORAL | Status: DC
  Administered 2013-10-05 – 2013-10-07 (×3): 500 mg via ORAL
  Filled 2013-10-04 (×4): qty 1

## 2013-10-04 MED ORDER — GLYCOPYRROLATE 0.2 MG/ML IJ SOLN
INTRAMUSCULAR | Status: DC | PRN
  Administered 2013-10-04: 0.4 mg via INTRAVENOUS

## 2013-10-04 MED ORDER — NEOSTIGMINE METHYLSULFATE 1 MG/ML IJ SOLN
INTRAMUSCULAR | Status: AC
  Filled 2013-10-04: qty 1

## 2013-10-04 MED ORDER — ONDANSETRON HCL 4 MG/2ML IJ SOLN: 4.0000 mg | Freq: Four times a day (QID) | INTRAMUSCULAR | Status: DC | PRN

## 2013-10-04 MED ORDER — VASOPRESSIN 20 UNIT/ML IJ SOLN
INTRAMUSCULAR | Status: AC
  Filled 2013-10-04: qty 3

## 2013-10-04 MED ORDER — HYDROCHLOROTHIAZIDE 25 MG PO TABS
25.0000 mg | ORAL_TABLET | Freq: Every day | ORAL | Status: DC
  Administered 2013-10-05 – 2013-10-07 (×3): 25 mg via ORAL
  Filled 2013-10-04 (×4): qty 1

## 2013-10-04 MED ORDER — SODIUM CHLORIDE 0.9 % IJ SOLN
INTRAMUSCULAR | Status: AC
  Filled 2013-10-04: qty 100

## 2013-10-04 MED ORDER — MENTHOL 3 MG MT LOZG: 1.0000 | LOZENGE | OROMUCOSAL | Status: DC | PRN

## 2013-10-04 MED ORDER — ROCURONIUM BROMIDE 100 MG/10ML IV SOLN
INTRAVENOUS | Status: DC | PRN
  Administered 2013-10-04: 50 mg via INTRAVENOUS
  Administered 2013-10-04: 10 mg via INTRAVENOUS

## 2013-10-04 MED ORDER — NEBIVOLOL HCL 5 MG PO TABS
5.0000 mg | ORAL_TABLET | Freq: Every day | ORAL | Status: DC
  Administered 2013-10-05 – 2013-10-07 (×3): 5 mg via ORAL
  Filled 2013-10-04 (×4): qty 1

## 2013-10-04 MED ORDER — LACTATED RINGERS IV SOLN
INTRAVENOUS | Status: DC
  Administered 2013-10-04 (×5): via INTRAVENOUS

## 2013-10-04 MED ORDER — MIDAZOLAM HCL 2 MG/2ML IJ SOLN
INTRAMUSCULAR | Status: DC | PRN
  Administered 2013-10-04: 2 mg via INTRAVENOUS

## 2013-10-04 MED ORDER — KETOROLAC TROMETHAMINE 30 MG/ML IJ SOLN
INTRAMUSCULAR | Status: AC
  Filled 2013-10-04: qty 1

## 2013-10-04 MED ORDER — HYDROMORPHONE HCL PF 1 MG/ML IJ SOLN
0.2500 mg | INTRAMUSCULAR | Status: DC | PRN
  Administered 2013-10-04 (×2): 0.5 mg via INTRAVENOUS

## 2013-10-04 MED ORDER — PROPOFOL 10 MG/ML IV EMUL
INTRAVENOUS | Status: AC
  Filled 2013-10-04: qty 20

## 2013-10-04 MED ORDER — DEXAMETHASONE SODIUM PHOSPHATE 10 MG/ML IJ SOLN
INTRAMUSCULAR | Status: AC
  Filled 2013-10-04: qty 1

## 2013-10-04 MED ORDER — PROMETHAZINE HCL 25 MG/ML IJ SOLN: 6.2500 mg | INTRAMUSCULAR | Status: DC | PRN

## 2013-10-04 MED ORDER — 0.9 % SODIUM CHLORIDE (POUR BTL) OPTIME
TOPICAL | Status: DC | PRN
  Administered 2013-10-04: 1000 mL

## 2013-10-04 MED ORDER — GLYCOPYRROLATE 0.2 MG/ML IJ SOLN
INTRAMUSCULAR | Status: AC
Start: 2013-10-04 — End: 2013-10-04
  Filled 2013-10-04: qty 2

## 2013-10-04 MED ORDER — KETOROLAC TROMETHAMINE 30 MG/ML IJ SOLN: 15.0000 mg | Freq: Once | INTRAMUSCULAR | Status: DC | PRN

## 2013-10-04 MED ORDER — OXYCODONE-ACETAMINOPHEN 5-325 MG PO TABS
1.0000 | ORAL_TABLET | ORAL | Status: DC | PRN
  Administered 2013-10-05 – 2013-10-06 (×6): 2 via ORAL
  Administered 2013-10-06: 1 via ORAL
  Administered 2013-10-06 – 2013-10-07 (×3): 2 via ORAL
  Filled 2013-10-04 (×10): qty 2

## 2013-10-04 MED ORDER — ONDANSETRON HCL 4 MG/2ML IJ SOLN
INTRAMUSCULAR | Status: AC
  Filled 2013-10-04: qty 2

## 2013-10-04 MED ORDER — DIPHENHYDRAMINE HCL 12.5 MG/5ML PO ELIX: 12.5000 mg | ORAL_SOLUTION | Freq: Four times a day (QID) | ORAL | Status: DC | PRN

## 2013-10-04 MED ORDER — LACTATED RINGERS IV SOLN
INTRAVENOUS | Status: DC
  Administered 2013-10-04: 21:00:00 via INTRAVENOUS

## 2013-10-04 MED ORDER — DIPHENHYDRAMINE HCL 50 MG/ML IJ SOLN: 12.5000 mg | Freq: Four times a day (QID) | INTRAMUSCULAR | Status: DC | PRN

## 2013-10-04 MED ORDER — CEFAZOLIN SODIUM-DEXTROSE 2-3 GM-% IV SOLR
2.0000 g | INTRAVENOUS | Status: AC
  Administered 2013-10-04: 2 g via INTRAVENOUS

## 2013-10-04 MED ORDER — BUPIVACAINE HCL (PF) 0.25 % IJ SOLN
INTRAMUSCULAR | Status: DC | PRN
  Administered 2013-10-04: 10 mL

## 2013-10-04 MED ORDER — ONDANSETRON HCL 4 MG PO TABS: 4.0000 mg | ORAL_TABLET | Freq: Four times a day (QID) | ORAL | Status: DC | PRN

## 2013-10-04 MED ORDER — HYDROMORPHONE 0.3 MG/ML IV SOLN
INTRAVENOUS | Status: DC
  Administered 2013-10-04: 1.5 mg via INTRAVENOUS
  Administered 2013-10-04: 18:00:00 via INTRAVENOUS
  Administered 2013-10-05: 4 mL via INTRAVENOUS
  Administered 2013-10-05: 1.5 mg via INTRAVENOUS
  Administered 2013-10-05: 0.6 mg via INTRAVENOUS
  Filled 2013-10-04: qty 25

## 2013-10-04 MED ORDER — PROPOFOL 10 MG/ML IV BOLUS
INTRAVENOUS | Status: DC | PRN
  Administered 2013-10-04: 200 mg via INTRAVENOUS

## 2013-10-04 MED ORDER — NALOXONE HCL 0.4 MG/ML IJ SOLN: 0.4000 mg | INTRAMUSCULAR | Status: DC | PRN

## 2013-10-04 MED ORDER — BUPIVACAINE HCL (PF) 0.25 % IJ SOLN
INTRAMUSCULAR | Status: AC
  Filled 2013-10-04: qty 30

## 2013-10-04 MED ORDER — FENTANYL CITRATE 0.05 MG/ML IJ SOLN
INTRAMUSCULAR | Status: DC | PRN
  Administered 2013-10-04 (×2): 100 ug via INTRAVENOUS
  Administered 2013-10-04 (×2): 50 ug via INTRAVENOUS
  Administered 2013-10-04 (×2): 100 ug via INTRAVENOUS

## 2013-10-04 MED ORDER — SCOPOLAMINE 1 MG/3DAYS TD PT72
MEDICATED_PATCH | TRANSDERMAL | Status: AC
  Administered 2013-10-04: 1.5 mg via TRANSDERMAL
  Filled 2013-10-04: qty 1

## 2013-10-04 MED ORDER — SODIUM CHLORIDE 0.9 % IJ SOLN: 9.0000 mL | INTRAMUSCULAR | Status: DC | PRN

## 2013-10-04 MED ORDER — KETOROLAC TROMETHAMINE 30 MG/ML IJ SOLN: 30.0000 mg | Freq: Three times a day (TID) | INTRAMUSCULAR | Status: DC

## 2013-10-04 MED ORDER — VASOPRESSIN 20 UNIT/ML IJ SOLN
INTRAMUSCULAR | Status: DC | PRN
  Administered 2013-10-04: 60 [IU]

## 2013-10-04 MED ORDER — KETOROLAC TROMETHAMINE 30 MG/ML IJ SOLN
INTRAMUSCULAR | Status: DC | PRN
  Administered 2013-10-04: 30 mg via INTRAMUSCULAR

## 2013-10-04 MED ORDER — HYDROMORPHONE HCL PF 1 MG/ML IJ SOLN
INTRAMUSCULAR | Status: DC | PRN
  Administered 2013-10-04: 1 mg via INTRAVENOUS

## 2013-10-04 MED ORDER — MEPERIDINE HCL 25 MG/ML IJ SOLN: 6.2500 mg | INTRAMUSCULAR | Status: DC | PRN

## 2013-10-04 SURGICAL SUPPLY — 46 items
APL SKNCLS STERI-STRIP NONHPOA (GAUZE/BANDAGES/DRESSINGS) ×1
BARRIER ADHS 3X4 INTERCEED (GAUZE/BANDAGES/DRESSINGS) ×1 IMPLANT
BENZOIN TINCTURE PRP APPL 2/3 (GAUZE/BANDAGES/DRESSINGS) ×1 IMPLANT
BRR ADH 4X3 ABS CNTRL BYND (GAUZE/BANDAGES/DRESSINGS) ×1
CANISTER SUCT 3000ML (MISCELLANEOUS) ×2 IMPLANT
CHLORAPREP W/TINT 26ML (MISCELLANEOUS) ×2 IMPLANT
CLOTH BEACON ORANGE TIMEOUT ST (SAFETY) ×2 IMPLANT
CONT SPEC PATH 64OZ SNAP LID (MISCELLANEOUS) ×2 IMPLANT
DECANTER SPIKE VIAL GLASS SM (MISCELLANEOUS) ×2 IMPLANT
DRAPE CESAREAN BIRTH W POUCH (DRAPES) ×1 IMPLANT
DRAPE WARM FLUID 44X44 (DRAPE) ×1 IMPLANT
DRSG OPSITE POSTOP 4X10 (GAUZE/BANDAGES/DRESSINGS) ×1 IMPLANT
ELECT NEEDLE TIP 2.8 STRL (NEEDLE) ×2 IMPLANT
FILTER STRAW FLUID ASPIR (MISCELLANEOUS) IMPLANT
GAUZE SPONGE 4X4 16PLY XRAY LF (GAUZE/BANDAGES/DRESSINGS) ×1 IMPLANT
GLOVE BIO SURGEON STRL SZ 6.5 (GLOVE) ×2 IMPLANT
GLOVE BIO SURGEON STRL SZ7 (GLOVE) ×3 IMPLANT
GLOVE BIOGEL PI IND STRL 7.0 (GLOVE) ×1 IMPLANT
GLOVE BIOGEL PI INDICATOR 7.0 (GLOVE) ×1
GLOVE INDICATOR 7.0 STRL GRN (GLOVE) ×3 IMPLANT
GOWN STRL REUS W/TWL LRG LVL3 (GOWN DISPOSABLE) ×6 IMPLANT
NDL HYPO 25X1 1.5 SAFETY (NEEDLE) ×2 IMPLANT
NEEDLE HYPO 25X1 1.5 SAFETY (NEEDLE) ×4 IMPLANT
NS IRRIG 1000ML POUR BTL (IV SOLUTION) ×2 IMPLANT
PACK ABDOMINAL GYN (CUSTOM PROCEDURE TRAY) ×2 IMPLANT
PAD OB MATERNITY 4.3X12.25 (PERSONAL CARE ITEMS) ×2 IMPLANT
SPONGE LAP 18X18 X RAY DECT (DISPOSABLE) ×2 IMPLANT
STAPLER VISISTAT 35W (STAPLE) IMPLANT
STRIP CLOSURE SKIN 1/2X4 (GAUZE/BANDAGES/DRESSINGS) ×1 IMPLANT
SUT MNCRL AB 4-0 PS2 18 (SUTURE) ×4 IMPLANT
SUT MON AB 2-0 CT1 27 (SUTURE) ×2 IMPLANT
SUT MON AB 2-0 SH 27 (SUTURE) ×4
SUT MON AB 2-0 SH27 (SUTURE) ×1 IMPLANT
SUT MON AB 3-0 SH 27 (SUTURE)
SUT MON AB 3-0 SH27 (SUTURE) IMPLANT
SUT PLAIN 2 0 XLH (SUTURE) ×1 IMPLANT
SUT VIC AB 0 CT1 27 (SUTURE) ×10
SUT VIC AB 0 CT1 27XBRD ANBCTR (SUTURE) ×5 IMPLANT
SUT VIC AB 0 CT1 36 (SUTURE) ×2 IMPLANT
SUT VIC AB 2-0 CT1 27 (SUTURE) ×4
SUT VIC AB 2-0 CT1 TAPERPNT 27 (SUTURE) IMPLANT
SUT VIC AB 4-0 KS 27 (SUTURE) ×1 IMPLANT
SYR CONTROL 10ML LL (SYRINGE) ×4 IMPLANT
TOWEL OR 17X24 6PK STRL BLUE (TOWEL DISPOSABLE) ×4 IMPLANT
TRAY FOLEY CATH 14FR (SET/KITS/TRAYS/PACK) ×2 IMPLANT
WATER STERILE IRR 1000ML POUR (IV SOLUTION) ×2 IMPLANT

## 2013-10-04 NOTE — Anesthesia Preprocedure Evaluation (Signed)
Anesthesia Evaluation  Patient identified by MRN, date of birth, ID band Patient awake    Reviewed: Allergy & Precautions, H&P , Patient's Chart, lab work & pertinent test results, reviewed documented beta blocker date and time   History of Anesthesia Complications Negative for: history of anesthetic complications  Airway Mallampati: II TM Distance: >3 FB Neck ROM: full    Dental   Pulmonary  breath sounds clear to auscultation        Cardiovascular Exercise Tolerance: Good hypertension, Rhythm:regular Rate:Normal     Neuro/Psych negative psych ROS   GI/Hepatic GERD-  ,  Endo/Other  diabetesMorbid obesity  Renal/GU      Musculoskeletal   Abdominal   Peds  Hematology  (+) anemia ,   Anesthesia Other Findings   Reproductive/Obstetrics                           Anesthesia Physical Anesthesia Plan  ASA: III  Anesthesia Plan: General ETT   Post-op Pain Management:    Induction:   Airway Management Planned:   Additional Equipment:   Intra-op Plan:   Post-operative Plan:   Informed Consent: I have reviewed the patients History and Physical, chart, labs and discussed the procedure including the risks, benefits and alternatives for the proposed anesthesia with the patient or authorized representative who has indicated his/her understanding and acceptance.   Dental Advisory Given  Plan Discussed with: CRNA, Surgeon and Anesthesiologist  Anesthesia Plan Comments:         Anesthesia Quick Evaluation

## 2013-10-04 NOTE — Anesthesia Postprocedure Evaluation (Signed)
Anesthesia Post Note  Patient: [name]  Procedure(s) Performed: Procedure(s) (LRB): Abdominal MYOMECTOMY (N/A)  Anesthesia type: General  Patient location: PACU  Post pain: Pain level controlled  Post assessment: Post-op Vital signs reviewed  Last Vitals:  Filed Vitals:   10/04/13 1715  BP:   Pulse: 71  Temp: 37.1 C  Resp: 18    Post vital signs: Reviewed  Level of consciousness: sedated  Complications: No apparent anesthesia complications

## 2013-10-04 NOTE — Op Note (Signed)
PRE-OPERATIVE DIAGNOSIS:  Uterine Fibroids  [address]  POST-OPERATIVE DIAGNOSIS:  Uterine Fibroids  PROCEDURE:  ABDOMINAL MYOMECTOMY  SURGEON: Elveria Royals, MD  ASSISTANT:  Princess Bruins, MD  ANESTHESIA:  General endotracheal  EBL: 350 cc  IVF: 2800 cc LR   Urine output: urine in foley 300 cc, clear  BLOOD ADMINISTERED:  none  LOCAL MEDICATIONS USED:  MARCAINE 0.25% 10 cc skin infiltration     SPECIMEN:  Multiple fibroids   DISPOSITION OF SPECIMEN:  PATHOLOGY  COUNTS:  YES  PATIENT DISPOSITION:  PACU - hemodynamically stable. Then admit for post-op care.   Delay start of Pharmacological VTE agent (>24hrs) due to surgical blood loss or risk of bleeding: yes  PROCEDURE:   Indication:  Symptomatic uterine fibroids with abdominal mass, pain and menorrhagia and anemia and desired future fertility. There was a 5 cm right ovarian cyst on pelvic sonogram, so she was consented for removal if present. Risks and complications of surgery including infection, bleeding, damage to internal organs, possible hysterectomy, possible blood transfusion and other including but not limited to surgery related problems including pneumonia, VTE reviewed. Informed written consent was obtained.   Patient was brought to the operating room with IV running. She received 2 gm Ancef. Underwent general anesthesia without difficulty and was given dorsal supine position, prepped and draped in sterile fashion. Foley catheter was placed. Exam under anesthesia noted uterus at the umbilicus but mobile. Pfannenstiel incision was made with scalpel and carried down to the underlying fascia with Bovie with excellent hemostasis.  Fascia incised and extended laterally. Fascia grasped with Kocher's and underlying rectus muscles were dissected down. Left Rectus was more adhesed and needed some dissection and cauterization of bleeding. Rectus muscles were separated in midline. Posterior rectus sheath and posterior peritoneum  was grasped with mosquitoes and peritoneal entry made and extended.  Large fibroid uterus was palpated. No adhesions noted. Upper abdomen felt normal. Uterus was delivered out of the incision with manipulation. Fallopian tubes and both ovaries appear normal. A large central posterior transmural myoma was palpated, 10-12 cm. Anteriorly 2 penduculated subserosal myoma noted. One more smaller 3-4 cm myoma noted posteriorly at the isthmus level that was better defined after the larger myoma was removed.  Dilute Pitressin was injected in the large central myoma first. With Needle tip cautery a vertical posterior incision was made since myoma was noted to be superior and posterior to the endometrial cavity on sonogram. Incision was carried down to the myoma when a hard calcified myoma was encountered. Cut edges were grasped with Allis clamps and by blunt and sharp dissection the myoma was delivered from the incision. Endometrial/ submucosal component was carefully dissected to prevent trauma to endometrial cavity and the roof of the cavity was dissected with scissors, avoiding cautery in that area. Dissection was also done away from the tubes to prevent tubal damage and uterus was kept moist with moist laps. Myoma was removed, the bed was cauterized carefully. Myoma bed was closed to prevent blood loss before tackling other myomas. Deeper sutures closing the roof of the endometrium while avoiding entering the cavity were taken with 2-0 Vicryl. Then using 0-Vicryl myoma bed was approximated in 2 layers and the serosa closed with 2-0 Monocryl in Base-ball stitch fashion with excellent hemostasis. Anteriorly the stalk of the subserosal myoma was injected with dilute Pitressin and cut with cautery. Bed was cauterized and approximated with 0-Vicryl and serosa with 2-0Monocryl with excellent hemostasis. Lastly, the posterior lower segment myoma was  removed after injecting serosa with dilute Pitression. Incision made over the  myoma and myoma was grasped, dissected out from the bed by sharp dissection and cauterization. Bed was closed with 0-Vicryl and serosa with 2-0 Monocryl. Hemostasis was excellent. Interceed placed on all the incisions. Uterus placed back in the pelvis. Peritoneal edges grasped and peritoneum closed with 2-0 Vicryl. Fascia sutured with 0 Vicryl from two ends and met in midline. Subcutaneous layer was deep and closed with 2-0 Plain gut. Skin approximated with 4-0 Vicryl in subcuticular fashion.  Sterile dressing placed.  Sterile Honeycomb dressing placed.  All  Instruments/ lap/ sponges counts were correct x2. No complications.  Dr Benjie Karvonen was the surgeon for entire case.   -V.Shenay Torti,MD

## 2013-10-04 NOTE — Transfer of Care (Signed)
Immediate Anesthesia Transfer of Care Note  Patient: [name]  Procedure(s) Performed: Procedure(s): Abdominal MYOMECTOMY (N/A)  Patient Location: PACU  Anesthesia Type:General  Level of Consciousness: awake, alert , oriented and patient cooperative  Airway & Oxygen Therapy: Patient Spontanous Breathing and Patient connected to nasal cannula oxygen  Post-op Assessment: Report given to PACU RN and Post -op Vital signs reviewed and stable  Post vital signs: Reviewed and stable  Complications: No apparent anesthesia complications

## 2013-10-05 ENCOUNTER — Encounter (HOSPITAL_COMMUNITY): Payer: Self-pay | Admitting: Obstetrics & Gynecology

## 2013-10-05 LAB — CBC
HCT: 32.7 % — ABNORMAL LOW (ref 36.0–46.0)
HEMOGLOBIN: 10.4 g/dL — AB (ref 12.0–15.0)
MCH: 26.4 pg (ref 26.0–34.0)
MCHC: 31.8 g/dL (ref 30.0–36.0)
MCV: 83 fL (ref 78.0–100.0)
PLATELETS: 236 10*3/uL (ref 150–400)
RBC: 3.94 MIL/uL (ref 3.87–5.11)
RDW: 22.1 % — AB (ref 11.5–15.5)
WBC: 12.7 10*3/uL — ABNORMAL HIGH (ref 4.0–10.5)

## 2013-10-05 LAB — GLUCOSE, CAPILLARY: Glucose-Capillary: 122 mg/dL — ABNORMAL HIGH (ref 70–99)

## 2013-10-05 NOTE — Anesthesia Postprocedure Evaluation (Signed)
  Anesthesia Post-op Note  Anesthesia Post Note  Patient: [name]  Procedure(s) Performed: Procedure(s) (LRB): Abdominal MYOMECTOMY (N/A)  Anesthesia type: General  Patient location: [facility]  Post pain: Pain level controlled  Post assessment: Post-op Vital signs reviewed  Last Vitals:  Filed Vitals:   10/05/13 0621  BP: 122/69  Pulse: 69  Temp: 36.6 C  Resp: 20    Post vital signs: Reviewed  Level of consciousness: sedated  Complications: No apparent anesthesia complications

## 2013-10-05 NOTE — Addendum Note (Signed)
Addendum created 10/05/13 0803 by Billie Lade, CRNA   Modules edited: Notes Section   Notes Section:  File: [id number]

## 2013-10-06 NOTE — Progress Notes (Signed)
2 Days Post-Op Procedure(s) (LRB): Abdominal MYOMECTOMY (N/A)  Subjective: Patient reports incisional pain, tolerating PO and no problems voiding.  Pain controls with meds, needs RTC. No vag bleeding.   Objective: I have reviewed patient's vital signs, intake and output, medications, labs and reviewed surgical findings.   Assessment: s/p Procedure(s): Abdominal MYOMECTOMY (N/A): stable, progressing well and tolerating diet  Plan: Advance diet Encourage ambulation Advance to PO medication Discontinue IV fluids   [name]

## 2013-10-06 NOTE — Progress Notes (Signed)
2 Days Post-Op Procedure(s) (LRB): Abdominal MYOMECTOMY (N/A)  Subjective: Patient reports incisional pain, tolerating PO, + BM and no problems voiding.    Objective: I have reviewed patient's vital signs, intake and output, medications and labs.  General: alert and cooperative Resp: clear to auscultation bilaterally Cardio: regular rate and rhythm, S1, S2 normal, no murmur, click, rub or gallop GI: soft, non-tender; bowel sounds normal; no masses,  no organomegaly Extremities: extremities normal, atraumatic, no cyanosis or edema  Assessment: s/p Procedure(s): Abdominal MYOMECTOMY (N/A): stable, progressing well, tolerating diet and continue pain meds, anticipate D/c homoe tomorrow.   Plan: Advance diet Encourage ambulation  LOS: 2 days    [name] R 10/06/2013

## 2013-10-07 LAB — TYPE AND SCREEN
ABO/RH(D): O POS
Antibody Screen: NEGATIVE
Unit division: 0
Unit division: 0

## 2013-10-07 MED ORDER — IBUPROFEN 200 MG PO TABS: 600.0000 mg | ORAL_TABLET | Freq: Four times a day (QID) | ORAL | Status: DC | PRN

## 2013-10-07 MED ORDER — OXYCODONE-ACETAMINOPHEN 5-325 MG PO TABS: 1.0000 | ORAL_TABLET | ORAL | Status: DC | PRN

## 2013-10-07 NOTE — Progress Notes (Signed)
3 Days Post-Op Procedure(s) (LRB): Abdominal MYOMECTOMY (N/A)  Subjective: Patient reports tolerating PO, + flatus and no problems voiding.  Denies vaginal bleeding. Pain with ambulation, moves very slow  Objective: I have reviewed patient's vital signs. BP 111/77  Pulse 73  Temp(Src) 98.4 F (36.9 C) (Oral)  Resp 18  Ht 5\' 7"  (1.702 m)  Wt 268 lb (121.564 kg)  BMI 41.96 kg/m2  SpO2 98% A&O x 3, no acute distress. Pleasant Lungs CTA bilat CV RRR, S1S2 normal Abdo soft, non tender, non acute,active BS all quadrants, incision CDI, honeycomb in place Extr no edema/ tenderness, neg Homans.  Assessment: s/p Procedure(s): Abdominal MYOMECTOMY (N/A): stable and progressing well  Plan: Discharge home. Post op care reviewed. F/up 2 wks. Pain mngmt, bowel care reviewed.   LOS: 3 days    Lyndi Holbein R 10/07/2013, 12:15 PM

## 2013-10-07 NOTE — Progress Notes (Signed)
Discharge instructions reviewed with patient.  Patient states understanding.  No home equipment needed.  Patient discharged via wheelchair in stable condition with staff without incident.

## 2013-10-07 NOTE — Discharge Instructions (Signed)
Laparotomy Care After Refer to this sheet in the next few weeks. These instructions provide you with information on caring for yourself after your procedure. Your caregiver may also give you more specific instructions. Your treatment has been planned according to current medical practices, but problems sometimes occur. Call your caregiver if you have any problems or questions after your procedure. HOME CARE INSTRUCTIONS ACTIVITY  Rest as much as possible the first two weeks at home.  Avoid strenuous activity such as heavy lifting (more than 10 pounds), pushing, or pulling. Limit stair climbing to once or twice a day for the first week, then slowly increase this activity.  Take frequent rest periods throughout the day.  Talk with your caregiver about when you may resume your usual physical activity.  You need to be out of bed and walking as much as possible. This decreases the chance of:  Blood clots.  Pneumonia. NUTRITION  You can resume your normal diet once you regain bowel function.  Drink plenty of fluids (6 8 glasses a day or as instructed by your caregiver).  Eat a well-balanced diet.  Daily portions of food from the meat (protein), milk, vegetable, and bread groups are necessary for your health. ELIMINATION It is very important not to strain during bowel movements. If constipation should occur, you may:  Take a mild laxative.  Add fruit and bran to your diet.  Drink more fluids. HYGIENE  Take showers, not baths, until 4 6 weeks after surgery.  If your incision is closed, you may take a shower or tub bath. FEVER If you feel feverish or have shaking chills, take your temperature. If it is 102 F (38.9 C), call your caregiver. The fever may mean there is an infection. PAIN CONTROL  Mild discomfort may occur.  Only take over-the-counter or prescription medicines for pain, discomfort, or fever as directed by your caregiver. Take any prescribed medicines exactly as  directed. INCISION CARE  Keep your incision site clean with soap and water.  Do not use a dressing unless your cut (incision) from surgery is draining or irritated.  If you have small adhesive strips in place and they do not fall off within 10 days, carefully peel them off.  Check your incision and surrounding area daily for any redness, swelling, discoloration, heavy drainage, or separation of the skin. SEXUAL INTERCOURSE Do not have sexual intercourse until after your follow-up appointment, unless your caregiver tells you otherwise. SEEK MEDICAL CARE IF:   You are unable to tolerate food or drinks.  You are unable to pass gas or have a bowel movement.  Your pain becomes more severe or is not relieved with medicines.  You have redness, swelling, discoloration, heavy drainage, or separation of the skin at the incision site. Document Released: 03/23/2004 Document Revised: 07/26/2012 Document Reviewed: 08/08/2007 [facility] Patient Information 2014 [place].

## 2013-10-07 NOTE — Discharge Summary (Signed)
Physician Discharge Summary  Patient ID: [name] MRN: [id number] DOB/AGE: 38-Oct-1977 38 y.o.  Admit date: 10/04/2013 Discharge date: 10/07/2013  Admission Diagnoses: Symptomatic uterine fibroids  Discharge Diagnoses:  Principal Problem:   Fibroids Active Problems:   S/P myomectomy  Discharged Condition: good  Hospital Course: Uncomplicated surgery and post-op recovery. Stable vital, labs, diet advanced, passing flatus, ambulating with encouragement, voiding, pain well controlled with medications.  Discharge Exam: Blood pressure 111/77, pulse 73, temperature 98.4 F (36.9 C), temperature source Oral, resp. rate 18, height 5\' 7"  (1.702 m), weight 268 lb (121.564 kg), SpO2 98.00%. Exam within normal limits.   Disposition:  home with mother.  Discharge Orders   Future Orders Complete By Expires   Call MD for:  difficulty breathing, headache or visual disturbances  As directed    Call MD for:  hives  As directed    Call MD for:  persistant dizziness or light-headedness  As directed    Call MD for:  persistant nausea and vomiting  As directed    Call MD for:  redness, tenderness, or signs of infection (pain, swelling, redness, odor or green/yellow discharge around incision site)  As directed    Call MD for:  severe uncontrolled pain  As directed    Call MD for:  temperature >100.4  As directed    Diet - low sodium heart healthy  As directed    Discharge wound care:  As directed    Comments:     Remove Honeycomb dressing on 10/10/13   Driving Restrictions  As directed    Comments:     4 wks   Increase activity slowly  As directed    Lifting restrictions  As directed    Comments:     6 wks up to 20 lbs only   Sexual Activity Restrictions  As directed    Comments:     6 wks       Medication List         BYSTOLIC 5 MG tablet  Generic drug:  nebivolol  Take 5 mg by mouth daily.     esomeprazole 20 MG capsule  Commonly known as:  NEXIUM  Take 20 mg by mouth as  needed.     ferrous sulfate 325 (65 FE) MG tablet  Take 1 tablet (325 mg total) by mouth 2 (two) times daily.     hydrochlorothiazide 25 MG tablet  Commonly known as:  HYDRODIURIL  Take 1 tablet (25 mg total) by mouth daily.     ibuprofen 200 MG tablet  Commonly known as:  ADVIL,MOTRIN  Take 3 tablets (600 mg total) by mouth every 6 (six) hours as needed for headache.     metFORMIN 500 MG tablet  Commonly known as:  GLUCOPHAGE  Take 1 tablet (500 mg total) by mouth daily with breakfast. PATIENT NEEDS OFFICE VISIT FOR ADDITIONAL REFILLS     norethindrone 0.35 MG tablet  Commonly known as:  MICRONOR,CAMILA,ERRIN  Take 1 tablet by mouth daily.     oxyCODONE-acetaminophen 5-325 MG per tablet  Commonly known as:  PERCOCET/ROXICET  Take 1-2 tablets by mouth every 4 (four) hours as needed for severe pain (moderate to severe pain (when tolerating fluids)).     vitamin C 500 MG tablet  Commonly known as:  ASCORBIC ACID  Take 500 mg by mouth daily.           Follow-up Information   Follow up with Keithan Dileonardo R, MD. Schedule an appointment as  soon as possible for a visit in 2 weeks. (post-op check)    Specialty:  Obstetrics and Gynecology   Contact information:   [place] [address] [phone]       Signed: Elveria Royals 10/07/2013, 12:22 PM

## 2013-12-17 ENCOUNTER — Encounter (HOSPITAL_COMMUNITY): Payer: Self-pay | Admitting: Emergency Medicine

## 2013-12-17 ENCOUNTER — Emergency Department (HOSPITAL_COMMUNITY)
Admission: EM | Admit: 2013-12-17 | Discharge: 2013-12-17 | Disposition: A | Discharge status: Home / Self Care | Payer: Managed Care, Other (non HMO) | Attending: Emergency Medicine | Admitting: Emergency Medicine

## 2013-12-17 DIAGNOSIS — Y9389 Activity, other specified: Secondary | ICD-10-CM | POA: Insufficient documentation

## 2013-12-17 DIAGNOSIS — E119 Type 2 diabetes mellitus without complications: Secondary | ICD-10-CM | POA: Insufficient documentation

## 2013-12-17 DIAGNOSIS — T23109A Burn of first degree of unspecified hand, unspecified site, initial encounter: Secondary | ICD-10-CM | POA: Insufficient documentation

## 2013-12-17 DIAGNOSIS — Z8719 Personal history of other diseases of the digestive system: Secondary | ICD-10-CM | POA: Insufficient documentation

## 2013-12-17 DIAGNOSIS — T22039A Burn of unspecified degree of unspecified upper arm, initial encounter: Secondary | ICD-10-CM | POA: Insufficient documentation

## 2013-12-17 DIAGNOSIS — Z862 Personal history of diseases of the blood and blood-forming organs and certain disorders involving the immune mechanism: Secondary | ICD-10-CM | POA: Insufficient documentation

## 2013-12-17 DIAGNOSIS — T22019A Burn of unspecified degree of unspecified forearm, initial encounter: Secondary | ICD-10-CM | POA: Insufficient documentation

## 2013-12-17 DIAGNOSIS — Z8742 Personal history of other diseases of the female genital tract: Secondary | ICD-10-CM | POA: Insufficient documentation

## 2013-12-17 DIAGNOSIS — Z79899 Other long term (current) drug therapy: Secondary | ICD-10-CM | POA: Insufficient documentation

## 2013-12-17 DIAGNOSIS — Y929 Unspecified place or not applicable: Secondary | ICD-10-CM | POA: Insufficient documentation

## 2013-12-17 DIAGNOSIS — I10 Essential (primary) hypertension: Secondary | ICD-10-CM | POA: Insufficient documentation

## 2013-12-17 DIAGNOSIS — X028XXA Other exposure to controlled fire in building or structure, initial encounter: Secondary | ICD-10-CM | POA: Insufficient documentation

## 2013-12-17 DIAGNOSIS — T300 Burn of unspecified body region, unspecified degree: Secondary | ICD-10-CM

## 2013-12-17 MED ORDER — OXYCODONE-ACETAMINOPHEN 5-325 MG PO TABS
1.0000 | ORAL_TABLET | Freq: Once | ORAL | Status: AC
  Administered 2013-12-17: 1 via ORAL
  Filled 2013-12-17: qty 1

## 2013-12-17 MED ORDER — SILVER SULFADIAZINE 1 % EX CREA
TOPICAL_CREAM | Freq: Once | CUTANEOUS | Status: AC
  Administered 2013-12-17: 1 via TOPICAL
  Filled 2013-12-17: qty 85

## 2013-12-17 MED ORDER — OXYCODONE-ACETAMINOPHEN 5-325 MG PO TABS: 1.0000 | ORAL_TABLET | Freq: Four times a day (QID) | ORAL | Status: DC | PRN

## 2013-12-17 NOTE — Discharge Instructions (Signed)
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
## 2013-12-18 NOTE — ED Provider Notes (Signed)
Medical screening examination/treatment/procedure(s) were performed by non-physician practitioner and as supervising physician I was immediately available for consultation/collaboration.   EKG Interpretation None       Threasa Beards, MD 12/18/13 2119

## 2014-01-24 ENCOUNTER — Ambulatory Visit: Payer: Managed Care, Other (non HMO) | Admitting: Family Medicine

## 2014-01-24 VITALS — BP 130/98 | HR 63 | Temp 98.2°F | Resp 18 | Ht 68.0 in | Wt 273.0 lb

## 2014-01-24 DIAGNOSIS — J019 Acute sinusitis, unspecified: Secondary | ICD-10-CM

## 2014-01-24 DIAGNOSIS — N898 Other specified noninflammatory disorders of vagina: Secondary | ICD-10-CM

## 2014-01-24 DIAGNOSIS — L293 Anogenital pruritus, unspecified: Secondary | ICD-10-CM

## 2014-01-24 DIAGNOSIS — E119 Type 2 diabetes mellitus without complications: Secondary | ICD-10-CM

## 2014-01-24 LAB — POCT GLYCOSYLATED HEMOGLOBIN (HGB A1C): HEMOGLOBIN A1C: 5.8

## 2014-01-24 LAB — POCT WET PREP WITH KOH
Bacteria Wet Prep HPF POC: NEGATIVE
KOH Prep POC: NEGATIVE
Trichomonas, UA: NEGATIVE

## 2014-01-24 MED ORDER — AMOXICILLIN 500 MG PO CAPS: 1000.0000 mg | ORAL_CAPSULE | Freq: Two times a day (BID) | ORAL | Status: DC

## 2014-01-24 MED ORDER — FLUCONAZOLE 150 MG PO TABS: 150.0000 mg | ORAL_TABLET | Freq: Once | ORAL | Status: DC

## 2014-01-24 NOTE — Patient Instructions (Signed)
You may have had a mild allergic reaction to the betadine- avoid this product in the future.  Take the amoxicillin as directed for a sinus infection.  Take a diflucan now, and again in one week for vaginal yeast.  Keep your genital area cool and dry, and avoid any topical products.  Let me know if you are not better soon!

## 2014-01-24 NOTE — Progress Notes (Addendum)
[facility] [address], [place] [address] [phone]  Date:  01/24/2014   Name:  [name]   DOB:  18-Feb-1976   MRN:  [id number]  PCP:  Elyn Peers, MD    Chief Complaint: Vaginal Itching, Nasal Congestion and Cough   History of Present Illness:  [name] is a 38 y.o. very pleasant female patient who presents with the following:  Here today with a couple of concerns.  History of fibroids s/p myomectomy.  Also history of HTN and DM.  Her PCP is Dr. Criss Rosales.   She has had trouble with her sinuses for a couple of weeks.  She notes pressure in her left sinuses, and headaches.  She did have sinus surgery a couple of years ago which has helped with her breathing.  This feels like a sinus infection to her.  She does not have a cough, no fever.  She has noted her glucose has been a little higher than usual.  She got this better by being more careful with her diet, but now has some itching in her vagina. She first tired a betadine douche, but this did not help and in fact seemed to make her feel much worse.  She also tried rephresh, then she had her period and felt even worse with using pads etc. The outer part of her vulva is itchy and irritated.  No dysuria, no discharge.    She has not had a recent A1c.  She is on metformin once a day.    Patient Active Problem List   Diagnosis Date Noted  . Fibroids 10/04/2013  . S/P myomectomy 10/04/2013  . HTN (hypertension) 10/24/2011  . DM (diabetes mellitus screen) 10/24/2011  . BMI 30.0-30.9,adult 10/24/2011    Past Medical History  Diagnosis Date  . PONV (postoperative nausea and vomiting)   . Diabetes mellitus without complication   . GERD (gastroesophageal reflux disease)   . Anemia   . Hypertension   . Fibroids 10/04/2013    Past Surgical History  Procedure Laterality Date  . Nasal sinus surgery  08/2011  . Cholecystectomy    . Breast surgery      breast reduction  . Myomectomy N/A 10/04/2013   Procedure: Abdominal MYOMECTOMY;  Surgeon: Elveria Royals, MD;  Location: [facility];  Service: Gynecology;  Laterality: N/A;  . Myomectomy      History  Substance Use Topics  . Smoking status: Never Smoker   . Smokeless tobacco: Not on file  . Alcohol Use: No    History reviewed. No pertinent family history.  No Known Allergies  Medication list has been reviewed and updated.  Current Outpatient Prescriptions on File Prior to Visit  Medication Sig Dispense Refill  . hydrochlorothiazide (HYDRODIURIL) 25 MG tablet Take 1 tablet (25 mg total) by mouth daily.  90 tablet  3  . metFORMIN (GLUCOPHAGE) 500 MG tablet Take 500 mg by mouth daily with breakfast.      . nebivolol (BYSTOLIC) 5 MG tablet Take 5 mg by mouth daily.       No current facility-administered medications on file prior to visit.    Review of Systems:  As per HPI- otherwise negative.   Physical Examination: Filed Vitals:   01/24/14 1745  BP: 130/98  Pulse: 63  Temp: 98.2 F (36.8 C)  Resp: 18   Filed Vitals:   01/24/14 1745  Height: 5\' 8"  (1.727 m)  Weight: 273 lb (123.832 kg)  Body mass index is 41.52 kg/(m^2). Ideal Body Weight: Weight in (lb) to have BMI = 25: 164.1  GEN: WDWN, NAD, Non-toxic, A & O x 3, obese, looks well HEENT: Atraumatic, Normocephalic. Neck supple. No masses, No LAD. Ears and Nose: No external deformity. CV: RRR, No M/G/R. No JVD. No thrill. No extra heart sounds. PULM: CTA B, no wheezes, crackles, rhonchi. No retractions. No resp. distress. No accessory muscle use. ABD: S, NT, ND, +BS. No rebound. No HSM. EXTR: No c/c/e NEURO Normal gait.  PSYCH: Normally interactive. Conversant. Not depressed or anxious appearing.  Calm demeanor.  GU: no lesions or vesicles, but her external genitals appear inflamed and irritated.  No ulcerations or lesions.  No CMT, no adnexal tenderness or masses   Results for orders placed in visit on 01/24/14  POCT GLYCOSYLATED HEMOGLOBIN (HGB A1C)       Result Value Ref Range   Hemoglobin A1C 5.8    POCT WET PREP WITH KOH      Result Value Ref Range   Trichomonas, UA Negative     Clue Cells Wet Prep HPF POC 0-1     Epithelial Wet Prep HPF POC 0-4     Yeast Wet Prep HPF POC present     Bacteria Wet Prep HPF POC neg     RBC Wet Prep HPF POC 0-1     WBC Wet Prep HPF POC 0-1     KOH Prep POC Negative       Assessment and Plan: Type II or unspecified type diabetes mellitus without mention of complication, not stated as uncontrolled - Plan: POCT glycosylated hemoglobin (Hb A1C), GC/Chlamydia Probe Amp  Vaginal itching - Plan: POCT Wet Prep with KOH, GC/Chlamydia Probe Amp, fluconazole (DIFLUCAN) 150 MG tablet, CANCELED: GC/Chlamydia Probe Amp  Sinusitis, acute - Plan: amoxicillin (AMOXIL) 500 MG capsule  Counseled that she likely had a reaction to the betadine douche she uses as she is shellfish allergic.   She will avoid this in the future.  Amoxicillin for sinusitis and diflucan to prevent yeast vaginitis   DM is well controlled on current regimen Signed Lamar Blinks, MD  6/6: received genprobe, negative.  Called and let her know.  She is already feeling better.

## 2014-01-26 LAB — GC/CHLAMYDIA PROBE AMP
CT PROBE, AMP APTIMA: NEGATIVE
GC PROBE AMP APTIMA: NEGATIVE

## 2014-06-18 ENCOUNTER — Ambulatory Visit (INDEPENDENT_AMBULATORY_CARE_PROVIDER_SITE_OTHER): Payer: Managed Care, Other (non HMO) | Admitting: Family Medicine

## 2014-06-18 VITALS — BP 122/86 | HR 92 | Temp 103.0°F | Resp 32 | Ht 67.5 in | Wt 273.5 lb

## 2014-06-18 DIAGNOSIS — R51 Headache: Secondary | ICD-10-CM

## 2014-06-18 DIAGNOSIS — R519 Headache, unspecified: Secondary | ICD-10-CM

## 2014-06-18 DIAGNOSIS — J0100 Acute maxillary sinusitis, unspecified: Secondary | ICD-10-CM

## 2014-06-18 DIAGNOSIS — R05 Cough: Secondary | ICD-10-CM

## 2014-06-18 DIAGNOSIS — R5081 Fever presenting with conditions classified elsewhere: Secondary | ICD-10-CM

## 2014-06-18 DIAGNOSIS — R059 Cough, unspecified: Secondary | ICD-10-CM

## 2014-06-18 MED ORDER — FLUCONAZOLE 150 MG PO TABS: 150.0000 mg | ORAL_TABLET | Freq: Once | ORAL | Status: AC

## 2014-06-18 MED ORDER — ALBUTEROL SULFATE HFA 108 (90 BASE) MCG/ACT IN AERS: 2.0000 | INHALATION_SPRAY | Freq: Four times a day (QID) | RESPIRATORY_TRACT | Status: AC | PRN

## 2014-06-18 MED ORDER — IBUPROFEN 200 MG PO TABS
600.0000 mg | ORAL_TABLET | Freq: Once | ORAL | Status: AC
  Administered 2014-06-18: 600 mg via ORAL

## 2014-06-18 MED ORDER — AMOXICILLIN-POT CLAVULANATE 875-125 MG PO TABS: 1.0000 | ORAL_TABLET | Freq: Two times a day (BID) | ORAL | Status: AC

## 2014-06-18 MED ORDER — BENZONATATE 100 MG PO CAPS: 200.0000 mg | ORAL_CAPSULE | Freq: Two times a day (BID) | ORAL | Status: AC | PRN

## 2014-06-18 MED ORDER — HYDROCODONE-HOMATROPINE 5-1.5 MG/5ML PO SYRP: 5.0000 mL | ORAL_SOLUTION | Freq: Every evening | ORAL | Status: AC | PRN

## 2014-06-18 NOTE — Progress Notes (Signed)
Chief Complaint:  Chief Complaint  Patient presents with  . Headache  . Cough  . Shortness of Breath    Feels like elephant is sitting on chest  . Fever  . Otalgia    with drainage  . Chills    HPI: [name] is a 38 y.o. female who is here for  HA and sinus pressure , since the last 5 days ago. She feels like an" elephant standing on her head and chest ", she feels stuffy and her ears hurt. She has had prior nasal surgery. She is wheezing and SOB, however it is because she is trying not to breath because it makes her cough when seh breathes and then when she coughs it makes her chest hurt. She states it is not like chest pain chest pain. She has  ear pain. She has tried otc meds without releif.   Past Medical History  Diagnosis Date  . PONV (postoperative nausea and vomiting)   . Diabetes mellitus without complication   . GERD (gastroesophageal reflux disease)   . Anemia   . Hypertension   . Fibroids 10/04/2013   Past Surgical History  Procedure Laterality Date  . Nasal sinus surgery  08/2011  . Cholecystectomy    . Breast surgery      breast reduction  . Myomectomy N/A 10/04/2013    Procedure: Abdominal MYOMECTOMY;  Surgeon: Elveria Royals, MD;  Location: [facility];  Service: Gynecology;  Laterality: N/A;  . Myomectomy     History   Social History  . Marital Status: Single    Spouse Name: N/A    Number of Children: N/A  . Years of Education: N/A   Social History Main Topics  . Smoking status: Never Smoker   . Smokeless tobacco: Never Used  . Alcohol Use: No  . Drug Use: No  . Sexual Activity: None   Other Topics Concern  . None   Social History Narrative  . None   History reviewed. No pertinent family history. Allergies  Allergen Reactions  . Shellfish Allergy     Mild- able to eat shrimp but not touch shellfish shells   Prior to Admission medications   Medication Sig Start Date End Date Taking? Authorizing Provider  hydrochlorothiazide  (HYDRODIURIL) 25 MG tablet Take 1 tablet (25 mg total) by mouth daily. 05/17/12  Yes Gay Filler Copland, MD  metFORMIN (GLUCOPHAGE) 500 MG tablet Take 500 mg by mouth daily with breakfast.   Yes Historical Provider, MD  nebivolol (BYSTOLIC) 5 MG tablet Take 5 mg by mouth daily.   Yes Historical Provider, MD     ROS: The patient denies night sweats, unintentional weight loss, palpitations,  nausea, vomiting, abdominal pain, dysuria, hematuria, melena, numbness, weakness, or tingling.   All other systems have been reviewed and were otherwise negative with the exception of those mentioned in the HPI and as above.    PHYSICAL EXAM: Filed Vitals:   06/18/14 1719  BP: 122/86  Pulse: 92  Temp: 103 F (39.4 C)  Resp: 32   Filed Vitals:   06/18/14 1719  Height: 5' 7.5" (1.715 m)  Weight: 273 lb 8 oz (124.059 kg)   Body mass index is 42.18 kg/(m^2).  General: Alert, no acute distress HEENT:  Normocephalic, atraumatic, oropharynx patent. EOMI, PERRLA TM nl, erythematous throat, No exudates. + boggy nares, + sinus tenderness. Cardiovascular:  Regular rate and rhythm, no rubs murmurs or gallops.  No Carotid bruits, radial pulse  intact. No pedal edema.  Respiratory: Clear to auscultation bilaterally.  No wheezes, rales, or rhonchi.  No cyanosis, no use of accessory musculature GI: No organomegaly, abdomen is soft and non-tender, positive bowel sounds.  No masses. Skin: No rashes. Neurologic: Facial musculature symmetric. Psychiatric: Patient is appropriate throughout our interaction. Lymphatic: No cervical lymphadenopathy Musculoskeletal: Gait intact.   LABS: Results for orders placed in visit on 01/24/14  GC/CHLAMYDIA PROBE AMP      Result Value Ref Range   CT Probe RNA NEGATIVE     GC Probe RNA NEGATIVE    POCT GLYCOSYLATED HEMOGLOBIN (HGB A1C)      Result Value Ref Range   Hemoglobin A1C 5.8    POCT WET PREP WITH KOH      Result Value Ref Range   Trichomonas, UA Negative     Clue  Cells Wet Prep HPF POC 0-1     Epithelial Wet Prep HPF POC 0-4     Yeast Wet Prep HPF POC present     Bacteria Wet Prep HPF POC neg     RBC Wet Prep HPF POC 0-1     WBC Wet Prep HPF POC 0-1     KOH Prep POC Negative       EKG/XRAY:   Primary read interpreted by Dr. Marin Comment at [facility].   ASSESSMENT/PLAN: Encounter Diagnoses  Name Primary?  . Acute maxillary sinusitis, recurrence not specified Yes  . Cough   . Fever presenting with conditions classified elsewhere   . Sinus headache    Rx Augmentin Rx tessalon perles, hycodan Rx albuterol prn F.u rpn for Chest xray prn, dclined today  Gross sideeffects, risk and benefits, and alternatives of medications d/w patient. Patient is aware that all medications have potential sideeffects and we are unable to predict every sideeffect or drug-drug interaction that may occur.  Sebastin Perlmutter, [place], DO 06/18/2014 5:46 PM

## 2014-06-18 NOTE — Patient Instructions (Signed)

## 2014-06-27 ENCOUNTER — Telehealth: Payer: Self-pay

## 2014-06-27 NOTE — Telephone Encounter (Signed)
Pt saw Dr.Le a little over a week ago for a cough that she states has not gone away, and her throat is starting to feel raw from all the coughing. Pt would like to know if there is something else she can be called in to help? Please advise

## 2014-06-28 MED ORDER — HYDROCOD POLST-CHLORPHEN POLST 10-8 MG/5ML PO LQCR: 5.0000 mL | Freq: Two times a day (BID) | ORAL | Status: DC | PRN

## 2014-06-28 NOTE — Telephone Encounter (Signed)
spokw with patient. She is feeling overall better but still having cough, taking mucinex but cough keeping her up at night. Will try Tussionex since hycodan helped some.

## 2014-06-29 ENCOUNTER — Telehealth: Payer: Self-pay

## 2014-06-29 NOTE — Telephone Encounter (Signed)
Patient states pharmacy was unable to fill rx for cough syrup because rx was written for 79ml. Please return call and advise. CB # [phone] if before 12 pm - if after 12 pm please call cell at [phone]

## 2014-07-01 MED ORDER — HYDROCOD POLST-CHLORPHEN POLST 10-8 MG/5ML PO LQCR: 5.0000 mL | Freq: Two times a day (BID) | ORAL | Status: AC | PRN

## 2014-07-01 NOTE — Telephone Encounter (Signed)
Notified pt Rx is ready.

## 2014-07-01 NOTE — Telephone Encounter (Signed)
Yes please. That is what I meant.

## 2014-07-01 NOTE — Telephone Encounter (Signed)
Can we get this rewritten for her for a 178ml

## 2014-07-23 ENCOUNTER — Ambulatory Visit (INDEPENDENT_AMBULATORY_CARE_PROVIDER_SITE_OTHER): Payer: Managed Care, Other (non HMO) | Admitting: Emergency Medicine

## 2014-07-23 VITALS — BP 118/94 | HR 73 | Temp 98.1°F | Resp 16 | Ht 67.75 in | Wt 258.0 lb

## 2014-07-23 DIAGNOSIS — L02419 Cutaneous abscess of limb, unspecified: Secondary | ICD-10-CM

## 2014-07-23 MED ORDER — SULFAMETHOXAZOLE-TRIMETHOPRIM 800-160 MG PO TABS: 1.0000 | ORAL_TABLET | Freq: Two times a day (BID) | ORAL | Status: AC

## 2014-07-23 NOTE — Progress Notes (Signed)
[facility] [address] [place] [address] [phone]  Date:  07/23/2014   Name:  JODELL WEITMAN   DOB:  Jan 11, 1976   MRN:  [id number]  PCP:  Elyn Peers, MD    Chief Complaint: Cyst   History of Present Illness:  [name] is a 38 y.o. very pleasant female patient who presents with the following:  Two month history of mass in left axilla now spontaneously draining.   No fever or chills Less painful. No improvement with over the counter medications or other home remedies.  Denies other complaint or health concern today.   Patient Active Problem List   Diagnosis Date Noted  . Fibroids 10/04/2013  . S/P myomectomy 10/04/2013  . HTN (hypertension) 10/24/2011  . DM (diabetes mellitus screen) 10/24/2011  . BMI 30.0-30.9,adult 10/24/2011    Past Medical History  Diagnosis Date  . PONV (postoperative nausea and vomiting)   . Diabetes mellitus without complication   . GERD (gastroesophageal reflux disease)   . Anemia   . Hypertension   . Fibroids 10/04/2013    Past Surgical History  Procedure Laterality Date  . Nasal sinus surgery  08/2011  . Cholecystectomy    . Breast surgery      breast reduction  . Myomectomy N/A 10/04/2013    Procedure: Abdominal MYOMECTOMY;  Surgeon: Elveria Royals, MD;  Location: [place] [facility];  Service: Gynecology;  Laterality: N/A;  . Myomectomy      History  Substance Use Topics  . Smoking status: Never Smoker   . Smokeless tobacco: Never Used  . Alcohol Use: No    History reviewed. No pertinent family history.  Allergies  Allergen Reactions  . Shellfish Allergy     Mild- able to eat shrimp but not touch shellfish shells    Medication list has been reviewed and updated.  Current Outpatient Prescriptions on File Prior to Visit  Medication Sig Dispense Refill  . hydrochlorothiazide (HYDRODIURIL) 25 MG tablet Take 1 tablet (25 mg total) by mouth daily. 90 tablet 3  . metFORMIN (GLUCOPHAGE) 500 MG tablet  Take 500 mg by mouth daily with breakfast.    . nebivolol (BYSTOLIC) 5 MG tablet Take 5 mg by mouth daily.    Marland Kitchen albuterol (PROVENTIL HFA;VENTOLIN HFA) 108 (90 BASE) MCG/ACT inhaler Inhale 2 puffs into the lungs every 6 (six) hours as needed for wheezing or shortness of breath. (Patient not taking: Reported on 07/23/2014) 1 Inhaler 0  . amoxicillin-clavulanate (AUGMENTIN) 875-125 MG per tablet Take 1 tablet by mouth 2 (two) times daily. (Patient not taking: Reported on 07/23/2014) 20 tablet 0  . benzonatate (TESSALON) 100 MG capsule Take 2 capsules (200 mg total) by mouth 2 (two) times daily as needed. (Patient not taking: Reported on 07/23/2014) 30 capsule 1  . chlorpheniramine-HYDROcodone (TUSSIONEX PENNKINETIC ER) 10-8 MG/5ML LQCR Take 5 mLs by mouth every 12 (twelve) hours as needed for cough (stop hycodan). (Patient not taking: Reported on 07/23/2014) 120 mL 0  . fluconazole (DIFLUCAN) 150 MG tablet Take 1 tablet (150 mg total) by mouth once. May repeat in 3-4 days if recur (Patient not taking: Reported on 07/23/2014) 2 tablet 0  . HYDROcodone-homatropine (HYCODAN) 5-1.5 MG/5ML syrup Take 5 mLs by mouth at bedtime as needed. (Patient not taking: Reported on 07/23/2014) 120 mL 0   No current facility-administered medications on file prior to visit.    Review of Systems:  As per HPI, otherwise negative.    Physical Examination:  Filed Vitals:   07/23/14 0821  BP: 118/94  Pulse: 73  Temp: 98.1 F (36.7 C)  Resp: 16   Filed Vitals:   07/23/14 0821  Height: 5' 7.75" (1.721 m)  Weight: 258 lb (117.028 kg)   Body mass index is 39.51 kg/(m^2). Ideal Body Weight: Weight in (lb) to have BMI = 25: 162.9   GEN: WDWN, NAD, Non-toxic, Alert & Oriented x 3 HEENT: Atraumatic, Normocephalic.  Ears and Nose: No external deformity. EXTR: No clubbing/cyanosis/edema NEURO: Normal gait.  PSYCH: Normally interactive. Conversant. Not depressed or anxious appearing.  Calm demeanor.  Draining cyst left  axilla  Assessment and Plan: Septra  Signed,  Ellison Carwin, MD

## 2014-07-23 NOTE — Patient Instructions (Signed)
